# Patient Record
Sex: Female | Born: 1975 | Race: Asian | Hispanic: No | Marital: Married | Smoking: Never smoker
Health system: Southern US, Community
[De-identification: ages and names within clinical notes are randomized; demographics above are authoritative.]

## PROBLEM LIST (undated history)

## (undated) DIAGNOSIS — M810 Age-related osteoporosis without current pathological fracture: Secondary | ICD-10-CM

## (undated) DIAGNOSIS — F419 Anxiety disorder, unspecified: Secondary | ICD-10-CM

## (undated) DIAGNOSIS — I10 Essential (primary) hypertension: Secondary | ICD-10-CM

## (undated) DIAGNOSIS — D649 Anemia, unspecified: Secondary | ICD-10-CM

## (undated) HISTORY — DX: Anemia, unspecified: D64.9

## (undated) HISTORY — PX: BACK SURGERY: SHX140

## (undated) HISTORY — DX: Anxiety disorder, unspecified: F41.9

## (undated) HISTORY — DX: Essential (primary) hypertension: I10

## (undated) HISTORY — DX: Age-related osteoporosis without current pathological fracture: M81.0

---

## 2000-06-05 ENCOUNTER — Ambulatory Visit (HOSPITAL_COMMUNITY): Admission: RE | Admit: 2000-06-05 | Discharge: 2000-06-05 | Payer: Self-pay | Admitting: Family Medicine

## 2000-06-05 ENCOUNTER — Encounter: Payer: Self-pay | Admitting: Family Medicine

## 2001-01-09 ENCOUNTER — Encounter: Payer: Self-pay | Admitting: Family Medicine

## 2001-01-09 ENCOUNTER — Ambulatory Visit (HOSPITAL_COMMUNITY): Admission: RE | Admit: 2001-01-09 | Discharge: 2001-01-09 | Payer: Self-pay | Admitting: Family Medicine

## 2001-06-16 ENCOUNTER — Emergency Department (HOSPITAL_COMMUNITY): Admission: EM | Admit: 2001-06-16 | Discharge: 2001-06-16 | Payer: Self-pay | Admitting: Emergency Medicine

## 2003-02-09 ENCOUNTER — Ambulatory Visit (HOSPITAL_COMMUNITY): Admission: RE | Admit: 2003-02-09 | Discharge: 2003-02-09 | Payer: Self-pay | Admitting: *Deleted

## 2003-02-09 ENCOUNTER — Encounter: Payer: Self-pay | Admitting: *Deleted

## 2004-04-21 ENCOUNTER — Other Ambulatory Visit: Admission: RE | Admit: 2004-04-21 | Discharge: 2004-04-21 | Payer: Self-pay | Admitting: Family Medicine

## 2005-04-24 ENCOUNTER — Other Ambulatory Visit: Admission: RE | Admit: 2005-04-24 | Discharge: 2005-04-24 | Payer: Self-pay | Admitting: Family Medicine

## 2005-07-12 ENCOUNTER — Encounter: Admission: RE | Admit: 2005-07-12 | Discharge: 2005-10-10 | Payer: Self-pay | Admitting: *Deleted

## 2006-04-26 ENCOUNTER — Other Ambulatory Visit: Admission: RE | Admit: 2006-04-26 | Discharge: 2006-04-26 | Payer: Self-pay | Admitting: Family Medicine

## 2007-05-07 ENCOUNTER — Other Ambulatory Visit: Admission: RE | Admit: 2007-05-07 | Discharge: 2007-05-07 | Payer: Self-pay | Admitting: Family Medicine

## 2007-10-16 DIAGNOSIS — M329 Systemic lupus erythematosus, unspecified: Secondary | ICD-10-CM

## 2007-10-16 DIAGNOSIS — IMO0002 Reserved for concepts with insufficient information to code with codable children: Secondary | ICD-10-CM

## 2007-10-16 HISTORY — DX: Reserved for concepts with insufficient information to code with codable children: IMO0002

## 2007-10-16 HISTORY — DX: Systemic lupus erythematosus, unspecified: M32.9

## 2007-12-25 ENCOUNTER — Encounter: Admission: RE | Admit: 2007-12-25 | Discharge: 2007-12-25 | Payer: Self-pay | Admitting: Gastroenterology

## 2008-01-05 ENCOUNTER — Ambulatory Visit (HOSPITAL_COMMUNITY): Admission: RE | Admit: 2008-01-05 | Discharge: 2008-01-05 | Payer: Self-pay | Admitting: Gastroenterology

## 2008-01-14 ENCOUNTER — Ambulatory Visit (HOSPITAL_COMMUNITY): Admission: RE | Admit: 2008-01-14 | Discharge: 2008-01-14 | Payer: Self-pay | Admitting: Gastroenterology

## 2008-06-14 ENCOUNTER — Other Ambulatory Visit: Admission: RE | Admit: 2008-06-14 | Discharge: 2008-06-14 | Payer: Self-pay | Admitting: Family Medicine

## 2008-09-24 ENCOUNTER — Encounter: Admission: RE | Admit: 2008-09-24 | Discharge: 2008-09-24 | Payer: Self-pay | Admitting: Rheumatology

## 2008-11-29 ENCOUNTER — Encounter: Admission: RE | Admit: 2008-11-29 | Discharge: 2008-11-29 | Payer: Self-pay | Admitting: Pulmonary Disease

## 2011-05-27 ENCOUNTER — Ambulatory Visit: Payer: Self-pay

## 2011-05-27 ENCOUNTER — Emergency Department: Payer: Self-pay | Admitting: Internal Medicine

## 2011-05-30 ENCOUNTER — Emergency Department: Payer: Self-pay | Admitting: Emergency Medicine

## 2011-06-03 ENCOUNTER — Emergency Department: Payer: Self-pay | Admitting: *Deleted

## 2011-06-10 ENCOUNTER — Emergency Department: Payer: Self-pay | Admitting: Internal Medicine

## 2011-08-01 DIAGNOSIS — M329 Systemic lupus erythematosus, unspecified: Secondary | ICD-10-CM | POA: Insufficient documentation

## 2011-08-01 DIAGNOSIS — M3500 Sicca syndrome, unspecified: Secondary | ICD-10-CM | POA: Insufficient documentation

## 2012-06-20 ENCOUNTER — Ambulatory Visit (HOSPITAL_COMMUNITY)
Admission: RE | Admit: 2012-06-20 | Discharge: 2012-06-20 | Disposition: A | Discharge status: Home / Self Care | Payer: BC Managed Care – PPO | Source: Ambulatory Visit | Attending: Family Medicine | Admitting: Family Medicine

## 2012-06-20 ENCOUNTER — Other Ambulatory Visit (HOSPITAL_COMMUNITY): Payer: Self-pay | Admitting: Family Medicine

## 2012-06-20 DIAGNOSIS — M25559 Pain in unspecified hip: Secondary | ICD-10-CM

## 2013-01-05 ENCOUNTER — Ambulatory Visit: Payer: Self-pay | Admitting: Physician Assistant

## 2014-01-05 DIAGNOSIS — O09819 Supervision of pregnancy resulting from assisted reproductive technology, unspecified trimester: Secondary | ICD-10-CM | POA: Insufficient documentation

## 2014-01-05 DIAGNOSIS — O3410 Maternal care for benign tumor of corpus uteri, unspecified trimester: Secondary | ICD-10-CM | POA: Insufficient documentation

## 2014-01-05 DIAGNOSIS — D259 Leiomyoma of uterus, unspecified: Secondary | ICD-10-CM | POA: Insufficient documentation

## 2015-12-28 DIAGNOSIS — N390 Urinary tract infection, site not specified: Secondary | ICD-10-CM | POA: Insufficient documentation

## 2016-10-18 DIAGNOSIS — M329 Systemic lupus erythematosus, unspecified: Secondary | ICD-10-CM | POA: Diagnosis not present

## 2016-10-18 DIAGNOSIS — M545 Low back pain: Secondary | ICD-10-CM | POA: Diagnosis not present

## 2016-10-18 DIAGNOSIS — M5416 Radiculopathy, lumbar region: Secondary | ICD-10-CM | POA: Diagnosis not present

## 2016-10-18 DIAGNOSIS — I10 Essential (primary) hypertension: Secondary | ICD-10-CM | POA: Diagnosis not present

## 2016-10-24 ENCOUNTER — Other Ambulatory Visit: Payer: Self-pay | Admitting: Rheumatology

## 2016-10-24 DIAGNOSIS — M5416 Radiculopathy, lumbar region: Secondary | ICD-10-CM

## 2016-10-24 DIAGNOSIS — M545 Low back pain: Secondary | ICD-10-CM

## 2016-11-01 ENCOUNTER — Ambulatory Visit
Admission: RE | Admit: 2016-11-01 | Discharge: 2016-11-01 | Disposition: A | Discharge status: Home / Self Care | Payer: 59 | Source: Ambulatory Visit | Attending: Rheumatology | Admitting: Rheumatology

## 2016-11-01 DIAGNOSIS — M5416 Radiculopathy, lumbar region: Secondary | ICD-10-CM

## 2016-11-01 DIAGNOSIS — M5126 Other intervertebral disc displacement, lumbar region: Secondary | ICD-10-CM | POA: Diagnosis not present

## 2016-11-01 DIAGNOSIS — M545 Low back pain: Secondary | ICD-10-CM

## 2016-11-07 ENCOUNTER — Other Ambulatory Visit: Payer: Self-pay | Admitting: Rheumatology

## 2016-11-07 DIAGNOSIS — M5127 Other intervertebral disc displacement, lumbosacral region: Secondary | ICD-10-CM

## 2016-11-09 ENCOUNTER — Ambulatory Visit
Admission: RE | Admit: 2016-11-09 | Discharge: 2016-11-09 | Disposition: A | Discharge status: Home / Self Care | Payer: 59 | Source: Ambulatory Visit | Attending: Rheumatology | Admitting: Rheumatology

## 2016-11-09 DIAGNOSIS — M5127 Other intervertebral disc displacement, lumbosacral region: Secondary | ICD-10-CM

## 2016-11-09 DIAGNOSIS — M5126 Other intervertebral disc displacement, lumbar region: Secondary | ICD-10-CM | POA: Diagnosis not present

## 2016-11-09 MED ORDER — METHYLPREDNISOLONE ACETATE 40 MG/ML INJ SUSP (RADIOLOG
120.0000 mg | Freq: Once | INTRAMUSCULAR | Status: AC
  Administered 2016-11-09: 120 mg via EPIDURAL

## 2016-11-09 MED ORDER — IOPAMIDOL (ISOVUE-M 200) INJECTION 41%
1.0000 mL | Freq: Once | INTRAMUSCULAR | Status: AC
  Administered 2016-11-09: 1 mL via EPIDURAL

## 2016-11-09 NOTE — Discharge Instructions (Signed)

## 2016-11-13 DIAGNOSIS — M5416 Radiculopathy, lumbar region: Secondary | ICD-10-CM | POA: Diagnosis not present

## 2016-11-13 DIAGNOSIS — M5126 Other intervertebral disc displacement, lumbar region: Secondary | ICD-10-CM | POA: Diagnosis not present

## 2016-11-20 DIAGNOSIS — M545 Low back pain: Secondary | ICD-10-CM | POA: Diagnosis not present

## 2016-11-20 DIAGNOSIS — M329 Systemic lupus erythematosus, unspecified: Secondary | ICD-10-CM | POA: Diagnosis not present

## 2016-11-20 DIAGNOSIS — M5416 Radiculopathy, lumbar region: Secondary | ICD-10-CM | POA: Diagnosis not present

## 2016-12-06 DIAGNOSIS — Z01812 Encounter for preprocedural laboratory examination: Secondary | ICD-10-CM | POA: Diagnosis not present

## 2016-12-14 DIAGNOSIS — M79661 Pain in right lower leg: Secondary | ICD-10-CM | POA: Diagnosis not present

## 2016-12-14 DIAGNOSIS — M5127 Other intervertebral disc displacement, lumbosacral region: Secondary | ICD-10-CM | POA: Diagnosis not present

## 2016-12-14 DIAGNOSIS — M5126 Other intervertebral disc displacement, lumbar region: Secondary | ICD-10-CM | POA: Diagnosis not present

## 2017-02-27 DIAGNOSIS — M810 Age-related osteoporosis without current pathological fracture: Secondary | ICD-10-CM | POA: Diagnosis not present

## 2017-02-27 DIAGNOSIS — I7300 Raynaud's syndrome without gangrene: Secondary | ICD-10-CM | POA: Diagnosis not present

## 2017-02-27 DIAGNOSIS — M5416 Radiculopathy, lumbar region: Secondary | ICD-10-CM | POA: Insufficient documentation

## 2017-02-27 DIAGNOSIS — M328 Other forms of systemic lupus erythematosus: Secondary | ICD-10-CM | POA: Diagnosis not present

## 2017-02-27 DIAGNOSIS — M3501 Sicca syndrome with keratoconjunctivitis: Secondary | ICD-10-CM | POA: Diagnosis not present

## 2017-03-01 DIAGNOSIS — J069 Acute upper respiratory infection, unspecified: Secondary | ICD-10-CM | POA: Diagnosis not present

## 2017-03-14 DIAGNOSIS — R808 Other proteinuria: Secondary | ICD-10-CM | POA: Diagnosis not present

## 2017-03-14 DIAGNOSIS — R319 Hematuria, unspecified: Secondary | ICD-10-CM | POA: Diagnosis not present

## 2017-04-18 DIAGNOSIS — I10 Essential (primary) hypertension: Secondary | ICD-10-CM | POA: Diagnosis not present

## 2017-06-06 DIAGNOSIS — J069 Acute upper respiratory infection, unspecified: Secondary | ICD-10-CM | POA: Diagnosis not present

## 2017-06-06 DIAGNOSIS — R05 Cough: Secondary | ICD-10-CM | POA: Diagnosis not present

## 2017-06-26 DIAGNOSIS — M328 Other forms of systemic lupus erythematosus: Secondary | ICD-10-CM | POA: Diagnosis not present

## 2017-06-26 DIAGNOSIS — I7300 Raynaud's syndrome without gangrene: Secondary | ICD-10-CM | POA: Diagnosis not present

## 2017-06-26 DIAGNOSIS — M3501 Sicca syndrome with keratoconjunctivitis: Secondary | ICD-10-CM | POA: Diagnosis not present

## 2017-08-08 DIAGNOSIS — R112 Nausea with vomiting, unspecified: Secondary | ICD-10-CM | POA: Diagnosis not present

## 2017-08-08 DIAGNOSIS — K219 Gastro-esophageal reflux disease without esophagitis: Secondary | ICD-10-CM | POA: Diagnosis not present

## 2017-10-22 DIAGNOSIS — I10 Essential (primary) hypertension: Secondary | ICD-10-CM | POA: Diagnosis not present

## 2017-11-26 DIAGNOSIS — M3501 Sicca syndrome with keratoconjunctivitis: Secondary | ICD-10-CM | POA: Diagnosis not present

## 2017-11-26 DIAGNOSIS — I7300 Raynaud's syndrome without gangrene: Secondary | ICD-10-CM | POA: Diagnosis not present

## 2017-11-26 DIAGNOSIS — M328 Other forms of systemic lupus erythematosus: Secondary | ICD-10-CM | POA: Diagnosis not present

## 2017-12-03 DIAGNOSIS — M3210 Systemic lupus erythematosus, organ or system involvement unspecified: Secondary | ICD-10-CM | POA: Diagnosis not present

## 2018-02-10 ENCOUNTER — Other Ambulatory Visit: Payer: Self-pay | Admitting: Family Medicine

## 2018-02-10 ENCOUNTER — Ambulatory Visit
Admission: RE | Admit: 2018-02-10 | Discharge: 2018-02-10 | Disposition: A | Discharge status: Home / Self Care | Payer: 59 | Source: Ambulatory Visit | Attending: Family Medicine | Admitting: Family Medicine

## 2018-02-10 DIAGNOSIS — J209 Acute bronchitis, unspecified: Secondary | ICD-10-CM | POA: Diagnosis not present

## 2018-02-10 DIAGNOSIS — R05 Cough: Secondary | ICD-10-CM | POA: Diagnosis not present

## 2018-02-10 DIAGNOSIS — J40 Bronchitis, not specified as acute or chronic: Secondary | ICD-10-CM

## 2018-02-19 DIAGNOSIS — R112 Nausea with vomiting, unspecified: Secondary | ICD-10-CM | POA: Diagnosis not present

## 2018-04-22 DIAGNOSIS — M328 Other forms of systemic lupus erythematosus: Secondary | ICD-10-CM | POA: Diagnosis not present

## 2018-04-22 DIAGNOSIS — M810 Age-related osteoporosis without current pathological fracture: Secondary | ICD-10-CM | POA: Diagnosis not present

## 2018-04-22 DIAGNOSIS — M3501 Sicca syndrome with keratoconjunctivitis: Secondary | ICD-10-CM | POA: Diagnosis not present

## 2018-05-01 DIAGNOSIS — K3184 Gastroparesis: Secondary | ICD-10-CM | POA: Diagnosis not present

## 2018-05-01 DIAGNOSIS — R112 Nausea with vomiting, unspecified: Secondary | ICD-10-CM | POA: Diagnosis not present

## 2018-05-01 DIAGNOSIS — R1013 Epigastric pain: Secondary | ICD-10-CM | POA: Diagnosis not present

## 2018-05-02 DIAGNOSIS — I10 Essential (primary) hypertension: Secondary | ICD-10-CM | POA: Diagnosis not present

## 2018-05-02 DIAGNOSIS — L930 Discoid lupus erythematosus: Secondary | ICD-10-CM | POA: Diagnosis not present

## 2018-05-06 DIAGNOSIS — Z780 Asymptomatic menopausal state: Secondary | ICD-10-CM | POA: Diagnosis not present

## 2020-05-15 HISTORY — PX: CHOLECYSTECTOMY: SHX55

## 2020-08-11 ENCOUNTER — Ambulatory Visit: Payer: 59 | Attending: Internal Medicine

## 2020-08-11 DIAGNOSIS — Z23 Encounter for immunization: Secondary | ICD-10-CM

## 2020-08-11 NOTE — Progress Notes (Signed)
   Covid-19 Vaccination Clinic  Name:  Gloria Mcpherson    MRN: [id number] DOB: Dec 07, 1975  08/11/2020  Gloria Mcpherson was observed post Covid-19 immunization for 15 minutes without incident. She was provided with Vaccine Information Sheet and instruction to access the V-Safe system.   Gloria Mcpherson was instructed to call 911 with any severe reactions post vaccine: Marland Kitchen Difficulty breathing  . Swelling of face and throat  . A fast heartbeat  . A bad rash all over body  . Dizziness and weakness

## 2020-10-18 ENCOUNTER — Other Ambulatory Visit: Payer: 59

## 2020-10-18 DIAGNOSIS — Z20822 Contact with and (suspected) exposure to covid-19: Secondary | ICD-10-CM

## 2020-10-20 LAB — SARS-COV-2, NAA 2 DAY TAT

## 2020-10-20 LAB — NOVEL CORONAVIRUS, NAA: SARS-CoV-2, NAA: NOT DETECTED

## 2020-12-12 DIAGNOSIS — D649 Anemia, unspecified: Secondary | ICD-10-CM | POA: Insufficient documentation

## 2020-12-12 NOTE — Progress Notes (Signed)
[facility] [facility]  [address], [address] [place], [place] [address] Phone: [phone]  Fax: [phone]   Clinic Day:  12/13/2020  Referring physician: Patterson HammersmithPatel, Mayur K, MD  Chief Complaint: Gloria Mcpherson is a 45 y.o. female with anemia who is referred in consultation by Dr. Gerrie NordmannMayur Patel for assessment and management.   HPI:  The patient saw Dr. Allena KatzPatel, rheumatology, on 11/30/2020. The patient has lupus and Sjogren's. She takes Plaquenil 200 mg BID and prednisone 5 mg daily. Hematocrit was 31.7, hemoglobin 9.2, MCV 78.7, platelets 426,000, WBC 8,600.  CBC followed: 03/04/2012:  Hematocrit 38.6, hemoglobin 12.4, MCV 93.7, platelets 210,000, WBC   3,900. 06/17/2014:  Hematocrit 28.4, hemoglobin   9.0, MCV 85.0, platelets 287,000, WBC   9,000. 12/26/2015:  Hematocrit 37.2, hemoglobin 12.4, MCV 89.0, platelets 215,000, WBC 11,200. 06/26/2017:  Hematocrit 37.8, hemoglobin 12.1, MCV 88.7, platelets 318,000, WBC   9,500. 02/03/2018:  Hematocrit 37.4, hemoglobin 12.3, MCV 88.2, platelets 288,000, WBC   7,100. 04/22/2018:  Hematocrit 37.3, hemoglobin 12.1, MCV 91.2, platelets 276,000, WBC   9,100. 10/23/2018:  Hematocrit 37.2, hemoglobin 11.9, MCV 91.2, platelets 347,000, WBC   8,700. 04/23/2019:  Hematocrit 34.3, hemoglobin 10.7, MCV 86.0, platelets 304,000, WBC   8,000. 10/22/2019:  Hematocrit 34.8, hemoglobin 10.6, MCV 83.7, platelets 341,000, WBC   7,600. 04/21/2020:  Hematocrit 35.8, hemoglobin 10.4, MCV 78.9, platelets 413,000, WBC   8,300. 07/07/2020:  Hematocrit 32.5, hemoglobin   9.9, MCV 74.3, platelets 384,000, WBC   9,200. 11/30/2020:  Hematocrit 31.7, hemoglobin   9.2, MCV 78.7, platelets 426,000, WBC   8,600.  Additional labs:  Vitamin B12 was 667 on 06/17/2014 and 478 on 02/03/2018.  Ferritin was 30 on 06/21/2014. Iron saturation was 5% and TIBC was 532 on 06/17/2014. Iron studies were normal on 03/04/2012.  CMP was normal on 11/30/2020.  No  colonoscopy or EGD on record. Per patient, last EGD was 2 years ago. Her last colonoscopy was when she was in high school for GI problems (vomiting, abdominal pain, slow transit).  Symptomatically, the patient has chronic abdominal symptoms. The patient has gastroparesis. She has abdominal pain, nausea, vomiting, and constipation. She does not do well with solid foods, especially meat. Her diet consists mostly of of fruits, vegetables, some beans, and soy products. She is trying to work fish back into her diet. She vomits when she eats fatty foods. She has been having these issues since 2008 and is not sure what started it. [facility] and has had GI problems for her entire life.  When she has bad episodes of abdominal symptoms, she resorts to only eating liquids and soups. She then adds solid foods back into her diet as tolerated. She takes lorazepam (for severe nausea), digestive enzymes, amitriptyline for stomach pain, compazine, and Marinol. Stress makes her symptoms worse.  Her lupus and Sjogren's are "not bad" right now. When they get bad, she increases her prednisone dose for a couple of days. Her shoulders, knees, elbows, wrists, and back ache. She sometimes gets dizzy when she stands up. Lately, she has felt "off" cognitively. For example, she has trouble spelling words that she should know how to spell when typing.  The patient denies fevers, sweats, headaches, changes in vision, runny nose, sore throat, cough, shortness of breath, chest pain, palpitations, urinary symptoms, numbness, weakness, and balance or coordination problems.  The patient has rare periods that last for about 2 days. Her periods have been irregular like this for 4-5  years. She denies any other bleeding. The patient received IV iron in the past while pregnant.  Her great aunt has lupus. Her great grandmother had cancer. She has a significant family history of heart attacks and strokes.   Past  Medical History:  Diagnosis Date  . Anemia   . Anxiety   . Hypertension   . Lupus (HCC)   . Lupus (HCC) 2009  . Osteoporosis     Past Surgical History:  Procedure Laterality Date  . BACK SURGERY     2019 had a laminectomy  . CESAREAN SECTION  07/2014  . CHOLECYSTECTOMY    . CHOLECYSTECTOMY  05/2020    Family History  Problem Relation Age of Onset  . Hypertension Mother   . Hypertension Paternal Uncle   . Diabetes Maternal Grandmother     Social History:  reports that she has never smoked. She has never used smokeless tobacco. She reports current alcohol use of about 1.0 standard drink of alcohol per week. She reports that she does not use drugs. She has two glasses of wine per week. She denies tobacco use. She has not been exposed to radiation or toxins. She lives in [place]. Her family is from [place]. She has lived in the [place] her whole life and spent summers in [place]. 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  Allergies:  Allergies  Allergen Reactions  . Cat Hair Extract Itching    Current Medications: Current Outpatient Medications  Medication Sig Dispense Refill  . acetaminophen (TYLENOL) 500 MG tablet Take 500 mg by mouth every 6 (six) hours as needed.    Marland Kitchen alendronate (FOSAMAX) 70 MG tablet Take 70 mg by mouth.    Marland Kitchen amitriptyline (ELAVIL) 75 MG tablet Take 75 mg by mouth.    Marland Kitchen aspirin EC 81 MG tablet Take 81 mg by mouth.    . dicyclomine (BENTYL) 10 MG capsule Take 10 mg by mouth every 6 (six) hours as needed.    . Digestive Enzymes TABS Take by mouth daily.    . hydroxychloroquine (PLAQUENIL) 200 MG tablet Take 200 mg by mouth daily.    Marland Kitchen LORazepam (ATIVAN) 1 MG tablet Take 1 mg by mouth as needed.    . NON FORMULARY Take by mouth 3 (three) times daily with meals.     . predniSONE (DELTASONE) 10 MG tablet Take 5 mg by mouth at bedtime as needed (take 5-20mg , as needed).    . prochlorperazine (COMPAZINE) 5 MG  tablet TAKE 2 TABLETS EVERY 8 HOURS AS NEEDED FOR NAUSEA    . RABEprazole (ACIPHEX) 20 MG tablet Take 20 mg by mouth daily.     No current facility-administered medications for this visit.    Review of Systems  Constitutional: Negative for chills, diaphoresis, fever, malaise/fatigue and weight loss.  HENT: Negative for congestion, ear discharge, ear pain, hearing loss, nosebleeds, sinus pain, sore throat and tinnitus.   Eyes: Negative for blurred vision.  Respiratory: Negative for cough, hemoptysis, sputum production and shortness of breath.   Cardiovascular: Negative for chest pain, palpitations and leg swelling.  Gastrointestinal: Positive for abdominal pain, constipation, nausea and vomiting. Negative for blood in stool, diarrhea, heartburn and melena.  Genitourinary: Negative for dysuria, frequency, hematuria and urgency.  Musculoskeletal: Positive for back pain and joint pain (knees, shoulders, elbows, wrists). Negative for myalgias and neck pain.  Skin: Negative for itching and rash.  Neurological: Positive for dizziness (when she stands up). Negative for  tingling, sensory change, weakness and headaches.       Feels "off" cognitively  Endo/Heme/Allergies: Does not bruise/bleed easily.  Psychiatric/Behavioral: Negative for depression and memory loss. The patient is not nervous/anxious and does not have insomnia.   All other systems reviewed and are negative.  Performance status (ECOG): 1  Vitals Blood pressure (!) 150/100, pulse (!) 108, temperature (!) 96.1 F (35.6 C), temperature source Tympanic, resp. rate 16, weight 185 lb 6.5 oz (84.1 kg), SpO2 100 %.   Physical Exam Vitals and nursing note reviewed.  Constitutional:      General: She is not in acute distress.    Appearance: She is not diaphoretic.  HENT:     Head: Normocephalic and atraumatic.     Comments: Dark hair.    Mouth/Throat:     Mouth: Mucous membranes are moist.     Pharynx: Oropharynx is clear.  Eyes:      General: No scleral icterus.    Extraocular Movements: Extraocular movements intact.     Conjunctiva/sclera: Conjunctivae normal.     Pupils: Pupils are equal, round, and reactive to light.     Comments: Glasses. Brown eyes.  Cardiovascular:     Rate and Rhythm: Normal rate and regular rhythm.     Heart sounds: Normal heart sounds. No murmur heard.   Pulmonary:     Effort: Pulmonary effort is normal. No respiratory distress.     Breath sounds: Normal breath sounds. No wheezing or rales.  Chest:     Chest wall: No tenderness.  Breasts:     Right: No axillary adenopathy or supraclavicular adenopathy.     Left: No axillary adenopathy or supraclavicular adenopathy.    Abdominal:     General: Bowel sounds are normal. There is no distension.     Palpations: Abdomen is soft. There is no mass.     Tenderness: There is abdominal tenderness. There is no guarding or rebound.  Musculoskeletal:        General: No swelling or tenderness. Normal range of motion.     Cervical back: Normal range of motion and neck supple.  Lymphadenopathy:     Head:     Right side of head: No preauricular, posterior auricular or occipital adenopathy.     Left side of head: No preauricular, posterior auricular or occipital adenopathy.     Cervical: No cervical adenopathy.     Upper Body:     Right upper body: No supraclavicular or axillary adenopathy.     Left upper body: No supraclavicular or axillary adenopathy.     Lower Body: No right inguinal adenopathy. No left inguinal adenopathy.  Skin:    General: Skin is warm and dry.  Neurological:     Mental Status: She is alert and oriented to person, place, and time.  Psychiatric:        Behavior: Behavior normal.        Thought Content: Thought content normal.        Judgment: Judgment normal.    No visits with results within 3 Day(s) from this visit.  Latest known visit with results is:  Lab on 10/18/2020  Component Date Value Ref Range Status  .  SARS-CoV-2, NAA 10/18/2020 Not Detected  Not Detected Final   Comment: This nucleic acid amplification test was developed and its performance characteristics determined by [employer]. Nucleic acid amplification tests include RT-PCR and TMA. This test has not been FDA cleared or approved. This test has been authorized by FDA  under an Emergency Use Authorization (EUA). This test is only authorized for the duration of time the declaration that circumstances exist justifying the authorization of the emergency use of in vitro diagnostic tests for detection of SARS-CoV-2 virus and/or diagnosis of COVID-19 infection under section 564(b)(1) of the Act, 21 U.S.C. 502DXA-1(O) (1), unless the authorization is terminated or revoked sooner. When diagnostic testing is negative, the possibility of a false negative result should be considered in the context of a patient's recent exposures and the presence of clinical signs and symptoms consistent with COVID-19. An individual without symptoms of COVID-19 and who is not shedding SARS-CoV-2 virus wo                          uld expect to have a negative (not detected) result in this assay.   Marland Kitchen SARS-CoV-2, NAA 2 DAY TAT 10/18/2020 Performed   Final    Assessment:  Gloria Mcpherson is a 45 y.o. female with a microcytic anemia felt secondary to iron deficiency.  MCV became microcytic on 04/21/2020.  Labs on 11/30/2020 revealed a hematocrit was 31.7, hemoglobin 9.2, MCV 78.7, platelets 426,000, WBC 8,600.  Ferritin was 30 on 06/21/2014. Iron saturation was 5% and TIBC was 532 on 06/17/2014.    Last EGD was 2 years ago. Her last colonoscopy was when she was in high school for GI problems (vomiting, abdominal pain, slow transit).  B12 has been followed:  667 on 06/17/2014 and 478 on 02/03/2018.  She has lupus and Sjogren's. She takes Plaquenil 200 mg BID and prednisone 5 mg daily.   She received the [employer] on  08/11/2020.  Symptomatically, she has chronic abdominal symptoms affecting her diet.  Menses is minimal.  She denies any bleeding.  Exam is normal.  Plan: 1.   Labs today:  CBC with diff, ferritin, iron studies, sed rate, CRP, B12, folate. 2.   Microcytic anemia  Patient developed microcytic RBC indices on 04/21/2020.  Etiology secondary to iron deficiency.  Discuss GI symptoms and affect on diet.  Patient denies any bleeding.  Discuss consideration of IV iron.   Information provided.  Preauth Venofer. 3.   RTC in 1 week for MD assessment, review of work-up, urine pregnancy test and initiation of week #1 Venofer.  I discussed the assessment and treatment plan with the patient.  The patient was provided an opportunity to ask questions and all were answered.  The patient agreed with the plan and demonstrated an understanding of the instructions.  The patient was advised to call back if the symptoms worsen or if the condition fails to improve as anticipated.  I provided 25 minutes of face-to-face time during this this encounter and > 50% was spent counseling as documented under my assessment and plan.  An additional 15+ minutes were spent reviewing her chart (Epic and Care Everywhere) including notes, labs, and imaging studies.    Keyvon Herter C. Merlene Pulling, MD, PhD    12/13/2020, 11:57 AM  I, Danella Penton Tufford, am acting as Neurosurgeon for [employer]. Merlene Pulling, MD, PhD.  I, Reita Shindler C. Merlene Pulling, MD, have reviewed the above documentation for accuracy and completeness, and I agree with the above.

## 2020-12-13 ENCOUNTER — Inpatient Hospital Stay: Payer: 59 | Attending: Hematology and Oncology | Admitting: Hematology and Oncology

## 2020-12-13 ENCOUNTER — Encounter: Payer: Self-pay | Admitting: Hematology and Oncology

## 2020-12-13 ENCOUNTER — Other Ambulatory Visit: Payer: Self-pay

## 2020-12-13 ENCOUNTER — Inpatient Hospital Stay: Payer: 59

## 2020-12-13 VITALS — BP 150/100 | HR 108 | Temp 96.1°F | Resp 16 | Wt 185.4 lb

## 2020-12-13 DIAGNOSIS — D508 Other iron deficiency anemias: Secondary | ICD-10-CM | POA: Diagnosis not present

## 2020-12-13 DIAGNOSIS — D649 Anemia, unspecified: Secondary | ICD-10-CM

## 2020-12-13 DIAGNOSIS — E538 Deficiency of other specified B group vitamins: Secondary | ICD-10-CM | POA: Diagnosis present

## 2020-12-13 DIAGNOSIS — D509 Iron deficiency anemia, unspecified: Secondary | ICD-10-CM | POA: Insufficient documentation

## 2020-12-13 LAB — FERRITIN: Ferritin: 5 ng/mL — ABNORMAL LOW (ref 11–307)

## 2020-12-13 LAB — CBC WITH DIFFERENTIAL/PLATELET
Abs Immature Granulocytes: 0.06 10*3/uL (ref 0.00–0.07)
Basophils Absolute: 0 10*3/uL (ref 0.0–0.1)
Basophils Relative: 0 %
Eosinophils Absolute: 0 10*3/uL (ref 0.0–0.5)
Eosinophils Relative: 0 %
HCT: 33.6 % — ABNORMAL LOW (ref 36.0–46.0)
Hemoglobin: 9.9 g/dL — ABNORMAL LOW (ref 12.0–15.0)
Immature Granulocytes: 1 %
Lymphocytes Relative: 18 %
Lymphs Abs: 2.1 10*3/uL (ref 0.7–4.0)
MCH: 22.8 pg — ABNORMAL LOW (ref 26.0–34.0)
MCHC: 29.5 g/dL — ABNORMAL LOW (ref 30.0–36.0)
MCV: 77.4 fL — ABNORMAL LOW (ref 80.0–100.0)
Monocytes Absolute: 1 10*3/uL (ref 0.1–1.0)
Monocytes Relative: 8 %
Neutro Abs: 8.5 10*3/uL — ABNORMAL HIGH (ref 1.7–7.7)
Neutrophils Relative %: 73 %
Platelets: 455 10*3/uL — ABNORMAL HIGH (ref 150–400)
RBC: 4.34 MIL/uL (ref 3.87–5.11)
RDW: 17.2 % — ABNORMAL HIGH (ref 11.5–15.5)
WBC: 11.7 10*3/uL — ABNORMAL HIGH (ref 4.0–10.5)
nRBC: 0 % (ref 0.0–0.2)

## 2020-12-13 LAB — FOLATE: Folate: 9.7 ng/mL (ref >5.9)

## 2020-12-13 LAB — IRON AND TIBC
Iron: 15 ug/dL — ABNORMAL LOW (ref 28–170)
Saturation Ratios: 3 % — ABNORMAL LOW (ref 10.4–31.8)
TIBC: 500 ug/dL — ABNORMAL HIGH (ref 250–450)
UIBC: 485 ug/dL

## 2020-12-13 LAB — VITAMIN B12: Vitamin B-12: 265 pg/mL (ref 180–914)

## 2020-12-13 LAB — SEDIMENTATION RATE: Sed Rate: 35 mm/hr — ABNORMAL HIGH (ref 0–20)

## 2020-12-13 LAB — C-REACTIVE PROTEIN: CRP: 1 mg/dL — ABNORMAL HIGH (ref <1.0)

## 2020-12-14 ENCOUNTER — Telehealth: Payer: Self-pay

## 2020-12-14 NOTE — Telephone Encounter (Signed)
-----   Message from Rosey Bath, MD sent at 12/14/2020  5:44 AM EST ----- Regarding: Please call patient  B12 is a little low.  B12 goal is 400.  Would she like to try oral b12 supplementation or B12 injections?  M  ----- Message ----- From: Leory Plowman, Lab In [place] Sent: 12/13/2020  12:46 PM EST To: Rosey Bath, MD

## 2020-12-14 NOTE — Telephone Encounter (Signed)
Patient states that she will try the oral. Advised her to start with per day. Please advise if you would like different. When would you like to recheck levels? Advised patient if she changes her mind to let us know and we can set up injections

## 2020-12-14 NOTE — Telephone Encounter (Signed)
  B12 1000 mcg a day is the standard dose.  I usually check levels in 1 month.  M

## 2020-12-15 ENCOUNTER — Telehealth: Payer: Self-pay

## 2020-12-16 ENCOUNTER — Encounter: Payer: Self-pay | Admitting: Hematology and Oncology

## 2020-12-19 ENCOUNTER — Encounter: Payer: Self-pay | Admitting: Hematology and Oncology

## 2020-12-19 ENCOUNTER — Telehealth: Payer: Self-pay

## 2020-12-19 ENCOUNTER — Other Ambulatory Visit: Payer: Self-pay

## 2020-12-19 DIAGNOSIS — D649 Anemia, unspecified: Secondary | ICD-10-CM

## 2020-12-19 NOTE — Progress Notes (Signed)
[facility]  [address] [place], [place] [address] Phone: [phone]  Fax: [phone]   Clinic Day:  12/20/2020  Referring physician: Deatra James, MD  Chief Complaint: Gloria Mcpherson is a 45 y.o. female with anemia who is seen for review of work-up and discussion regarding direction of therapy.  HPI: The patient was last seen in the hematology clinic on 12/13/2020 for new patient assessment. She noted a long standing history of abdominal symptoms.  She has gastroparesis.  Diet is limited.  Work-up revealed a hematocrit of 33.6, hemoglobin 9.9, MCV 77.4, platelets 455,000, WBC 11,700. Ferritin was 5 with an iron saturation of 3% and a TIBC of 500. Vitamin B12 was 265 and folate 9.7. CRP was 1.0 and sed rate 35.  During the interim, she has been "tired and nauseated." She has been eating a traditional Chinese egg dish this week. It does not bother her stomach as much as other foods.  Her joint pain is stable. She still sometimes gets dizzy when she stands up. She would like a vitamin B12 injection and then will start taking oral B12.  The patient has received Venofer before and tolerated it well.   Past Medical History:  Diagnosis Date  . Anemia   . Anxiety   . Hypertension   . Lupus (HCC)   . Lupus (HCC) 2009  . Osteoporosis     Past Surgical History:  Procedure Laterality Date  . BACK SURGERY     2019 had a laminectomy  . CESAREAN SECTION  07/2014  . CHOLECYSTECTOMY    . CHOLECYSTECTOMY  05/2020    Family History  Problem Relation Age of Onset  . Hypertension Mother   . Hypertension Paternal Uncle   . Diabetes Maternal Grandmother     Social History:  reports that she has never smoked. She has never used smokeless tobacco. She reports current alcohol use of about 1.0 standard drink of alcohol per week. She reports that she does not use drugs. She has two glasses of wine per week. She denies tobacco use. She has not  been exposed to radiation or toxins. She lives in [place]. Her family is from [place]. She has lived in the [place] her whole life and spent summers in [place]. 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  Allergies:  Allergies  Allergen Reactions  . Cat Hair Extract Itching    Current Medications: Current Outpatient Medications  Medication Sig Dispense Refill  . acetaminophen (TYLENOL) 500 MG tablet Take 500 mg by mouth every 6 (six) hours as needed.    Marland Kitchen alendronate (FOSAMAX) 70 MG tablet Take 70 mg by mouth.    Marland Kitchen amitriptyline (ELAVIL) 75 MG tablet Take 75 mg by mouth.    Marland Kitchen amLODIPine-Valsartan-HCTZ 10-160-12.5 MG TABS     . dicyclomine (BENTYL) 10 MG capsule Take 10 mg by mouth every 6 (six) hours as needed.    . Digestive Enzymes TABS Take by mouth daily.    Marland Kitchen dronabinol (MARINOL) 5 MG capsule Take by mouth. Patient takes twice a day    . famotidine (PEPCID) 40 MG tablet Take by mouth.    . hydroxychloroquine (PLAQUENIL) 200 MG tablet Take 200 mg by mouth daily.    Marland Kitchen LORazepam (ATIVAN) 1 MG tablet Take 1 mg by mouth as needed.    . predniSONE (DELTASONE) 10 MG tablet Take 5 mg by mouth at bedtime as needed (take 5-20mg , as needed).    Marland Kitchen  prochlorperazine (COMPAZINE) 5 MG tablet TAKE 2 TABLETS EVERY 8 HOURS AS NEEDED FOR NAUSEA    . RABEprazole (ACIPHEX) 20 MG tablet Take 20 mg by mouth daily.    Marland Kitchen aspirin EC 81 MG tablet Take 81 mg by mouth. (Patient not taking: Reported on 12/20/2020)    . naproxen sodium (ALEVE) 220 MG tablet Take by mouth. (Patient not taking: Reported on 12/20/2020)    . NON FORMULARY Take by mouth 3 (three) times daily with meals.  (Patient not taking: Reported on 12/20/2020)     No current facility-administered medications for this visit.    Review of Systems  Constitutional: Positive for malaise/fatigue. Negative for chills, diaphoresis, fever and weight loss (3 lbs).       Feels "tired."  HENT: Negative for  congestion, ear discharge, ear pain, hearing loss, nosebleeds, sinus pain, sore throat and tinnitus.   Eyes: Negative for blurred vision.  Respiratory: Negative for cough, hemoptysis, sputum production and shortness of breath.   Cardiovascular: Negative for chest pain, palpitations and leg swelling.  Gastrointestinal: Positive for abdominal pain and nausea. Negative for blood in stool, constipation, diarrhea, heartburn, melena and vomiting.  Genitourinary: Negative for dysuria, frequency, hematuria and urgency.  Musculoskeletal: Positive for back pain and joint pain (knees, shoulders, elbows, wrists). Negative for myalgias and neck pain.  Skin: Negative for itching and rash.  Neurological: Positive for dizziness (when she stands up). Negative for tingling, sensory change, weakness and headaches.  Endo/Heme/Allergies: Does not bruise/bleed easily.  Psychiatric/Behavioral: Negative for depression and memory loss. The patient is not nervous/anxious and does not have insomnia.   All other systems reviewed and are negative.  Performance status (ECOG): 1  Vitals Blood pressure 118/80, pulse 88, temperature 97.7 F (36.5 C), temperature source Tympanic, resp. rate 18, weight 182 lb 15.7 oz (83 kg), SpO2 100 %.   Physical Exam Vitals and nursing note reviewed.  Constitutional:      General: She is not in acute distress.    Appearance: She is not diaphoretic.  HENT:     Mouth/Throat:     Mouth: Mucous membranes are moist.     Pharynx: Oropharynx is clear.  Eyes:     General: No scleral icterus.    Conjunctiva/sclera: Conjunctivae normal.  Cardiovascular:     Rate and Rhythm: Normal rate and regular rhythm.     Heart sounds: Normal heart sounds. No murmur heard.   Pulmonary:     Effort: Pulmonary effort is normal. No respiratory distress.     Breath sounds: Normal breath sounds. No wheezing or rales.  Chest:     Chest wall: No tenderness.  Musculoskeletal:     Right lower leg: No  edema.     Left lower leg: No edema.  Neurological:     Mental Status: She is alert and oriented to person, place, and time.  Psychiatric:        Behavior: Behavior normal.        Thought Content: Thought content normal.        Judgment: Judgment normal.    Appointment on 12/20/2020  Component Date Value Ref Range Status  . Preg Test, Ur 12/20/2020 NEGATIVE  NEGATIVE Final   Performed at [facility], [address], [place], [place] [address]    Assessment:  Gloria Mcpherson is a 45 y.o. female with iron deficiency anemia secondary to diet and chronic GI symptoms.  MCV became microcytic on 04/21/2020.  Labs on 11/30/2020 revealed a hematocrit was  31.7, hemoglobin 9.2, MCV 78.7, platelets 426,000, WBC 8,600.  Ferritin was 30 on 06/21/2014. Iron saturation was 5% and TIBC was 532 on 06/17/2014.    Work-up on 12/13/2020 revealed a hematocrit of 33.6, hemoglobin 9.9, MCV 77.4, platelets 455,000, WBC 11,700.  Ferritin was 5 with an iron saturation of 3% and a TIBC of 500. Vitamin B12 was 265 and folate 9.7. CRP was 1.0 and sed rate 35.  Last colonoscopy was in high school.  EGD in 2020 is unavailable.  She is followed by Dr Beverly Gust in gastroenterology at [facility].  B12 has been followed:  667 on 06/17/2014, 478 on 02/03/2018, and 265 on 12/13/2020.  She has lupus and Sjogren's. She is on Plaquenil 200 mg BID and prednisone 5 mg daily.   The patient received the [employer] on 08/11/2020.  Symptomatically, she feels "tired and nauseated." Exam is stable.  Plan: 1.   Iron deficiency anemia  Hematocrit 33.6.  Hemoglobin 9.9.  MCV 77.4 on 12/13/2020.     Ferritin 5 with an iron saturation of 3%.    She developed microcytic RBC indices in 04/21/2020.  She denies any bleeding.  Venofer today and weekly x2 (total 3) with urine pregnancy test. 2.   B12 deficiency  B12 was 265 on 12/13/2020.  B12 today. 3.   RTC in 6-8 weeks for  labs (CBC, ferritin, B12). 4.   RTC in 4 months for MD assessment, labs (CBC, ferritin, iron studies- day before) and +/- Venofer.  I discussed the assessment and treatment plan with the patient.  The patient was provided an opportunity to ask questions and all were answered.  The patient agreed with the plan and demonstrated an understanding of the instructions.  The patient was advised to call back if the symptoms worsen or if the condition fails to improve as anticipated.   Diasha Castleman C. Merlene Pulling, MD, PhD    12/20/2020, 11:37 AM  I, Danella Penton Tufford, am acting as Neurosurgeon for [employer]. Merlene Pulling, MD, PhD.  I, Aneta Hendershott C. Merlene Pulling, MD, have reviewed the above documentation for accuracy and completeness, and I agree with the above.

## 2020-12-20 ENCOUNTER — Inpatient Hospital Stay: Payer: 59

## 2020-12-20 ENCOUNTER — Other Ambulatory Visit: Payer: Self-pay

## 2020-12-20 ENCOUNTER — Inpatient Hospital Stay: Payer: 59 | Admitting: Hematology and Oncology

## 2020-12-20 ENCOUNTER — Ambulatory Visit: Payer: 59

## 2020-12-20 ENCOUNTER — Other Ambulatory Visit: Payer: Self-pay | Admitting: Hematology and Oncology

## 2020-12-20 ENCOUNTER — Encounter: Payer: Self-pay | Admitting: Hematology and Oncology

## 2020-12-20 VITALS — BP 118/80 | HR 88 | Temp 97.7°F | Resp 18 | Wt 183.0 lb

## 2020-12-20 DIAGNOSIS — D508 Other iron deficiency anemias: Secondary | ICD-10-CM

## 2020-12-20 DIAGNOSIS — E538 Deficiency of other specified B group vitamins: Secondary | ICD-10-CM

## 2020-12-20 DIAGNOSIS — D649 Anemia, unspecified: Secondary | ICD-10-CM

## 2020-12-20 LAB — PREGNANCY, URINE: Preg Test, Ur: NEGATIVE

## 2020-12-20 MED ORDER — SODIUM CHLORIDE 0.9 % IV SOLN
Freq: Once | INTRAVENOUS | Status: AC
  Filled 2020-12-20: qty 250

## 2020-12-20 MED ORDER — IRON SUCROSE 20 MG/ML IV SOLN
200.0000 mg | Freq: Once | INTRAVENOUS | Status: AC
  Administered 2020-12-20: 200 mg via INTRAVENOUS
  Filled 2020-12-20: qty 10

## 2020-12-20 MED ORDER — CYANOCOBALAMIN 1000 MCG/ML IJ SOLN
1000.0000 ug | Freq: Once | INTRAMUSCULAR | Status: AC
  Administered 2020-12-20: 1000 ug via INTRAMUSCULAR
  Filled 2020-12-20: qty 1

## 2020-12-20 MED ORDER — SODIUM CHLORIDE 0.9 % IV SOLN: 200.0000 mg | Freq: Once | INTRAVENOUS | Status: DC

## 2020-12-20 NOTE — Telephone Encounter (Signed)
Sure thing! I've made a note of it and will schedule when she checks out today. Thanks! Victorino Dike

## 2020-12-20 NOTE — Progress Notes (Signed)
Patient reports nausea 

## 2020-12-20 NOTE — Progress Notes (Signed)
Pt received prescribed treatment in clinic, pt stable at d/c. 

## 2020-12-27 ENCOUNTER — Inpatient Hospital Stay: Payer: 59

## 2020-12-27 ENCOUNTER — Other Ambulatory Visit: Payer: Self-pay

## 2020-12-27 VITALS — BP 117/83 | HR 105 | Temp 97.6°F | Resp 18

## 2020-12-27 DIAGNOSIS — D508 Other iron deficiency anemias: Secondary | ICD-10-CM

## 2020-12-27 DIAGNOSIS — E538 Deficiency of other specified B group vitamins: Secondary | ICD-10-CM

## 2020-12-27 LAB — PREGNANCY, URINE: Preg Test, Ur: NEGATIVE

## 2020-12-27 MED ORDER — SODIUM CHLORIDE 0.9 % IV SOLN: 200.0000 mg | Freq: Once | INTRAVENOUS | Status: DC

## 2020-12-27 MED ORDER — SODIUM CHLORIDE 0.9 % IV SOLN
Freq: Once | INTRAVENOUS | Status: AC
  Filled 2020-12-27: qty 250

## 2020-12-27 MED ORDER — IRON SUCROSE 20 MG/ML IV SOLN
200.0000 mg | Freq: Once | INTRAVENOUS | Status: AC
  Administered 2020-12-27: 200 mg via INTRAVENOUS
  Filled 2020-12-27: qty 10

## 2021-01-03 ENCOUNTER — Other Ambulatory Visit: Payer: Self-pay

## 2021-01-03 ENCOUNTER — Inpatient Hospital Stay: Payer: 59

## 2021-01-03 VITALS — BP 120/82 | HR 100

## 2021-01-03 DIAGNOSIS — D508 Other iron deficiency anemias: Secondary | ICD-10-CM

## 2021-01-03 DIAGNOSIS — E538 Deficiency of other specified B group vitamins: Secondary | ICD-10-CM

## 2021-01-03 LAB — PREGNANCY, URINE: Preg Test, Ur: NEGATIVE

## 2021-01-03 MED ORDER — SODIUM CHLORIDE 0.9 % IV SOLN: 200.0000 mg | Freq: Once | INTRAVENOUS | Status: DC

## 2021-01-03 MED ORDER — IRON SUCROSE 20 MG/ML IV SOLN
200.0000 mg | Freq: Once | INTRAVENOUS | Status: AC
  Administered 2021-01-03: 200 mg via INTRAVENOUS
  Filled 2021-01-03: qty 10

## 2021-01-03 MED ORDER — SODIUM CHLORIDE 0.9 % IV SOLN
Freq: Once | INTRAVENOUS | Status: AC
Start: 2021-01-03 — End: 2021-01-03
  Filled 2021-01-03: qty 250

## 2021-01-03 NOTE — Progress Notes (Signed)
Patient tolerated Venofer infusion well, no concerns voiced. Patient discharged. Stable. 

## 2021-01-04 ENCOUNTER — Other Ambulatory Visit: Payer: Self-pay | Admitting: Family Medicine

## 2021-01-04 DIAGNOSIS — Z1231 Encounter for screening mammogram for malignant neoplasm of breast: Secondary | ICD-10-CM

## 2021-01-11 ENCOUNTER — Ambulatory Visit
Admission: RE | Admit: 2021-01-11 | Discharge: 2021-01-11 | Disposition: A | Discharge status: Home / Self Care | Payer: 59 | Source: Ambulatory Visit | Attending: Family Medicine | Admitting: Family Medicine

## 2021-01-11 ENCOUNTER — Other Ambulatory Visit: Payer: Self-pay

## 2021-01-11 DIAGNOSIS — Z1231 Encounter for screening mammogram for malignant neoplasm of breast: Secondary | ICD-10-CM

## 2021-01-13 ENCOUNTER — Other Ambulatory Visit: Payer: Self-pay | Admitting: Family Medicine

## 2021-01-13 DIAGNOSIS — R928 Other abnormal and inconclusive findings on diagnostic imaging of breast: Secondary | ICD-10-CM

## 2021-01-23 ENCOUNTER — Inpatient Hospital Stay: Payer: 59 | Attending: Hematology and Oncology

## 2021-01-23 ENCOUNTER — Other Ambulatory Visit: Payer: Self-pay

## 2021-01-23 DIAGNOSIS — E538 Deficiency of other specified B group vitamins: Secondary | ICD-10-CM | POA: Diagnosis not present

## 2021-01-23 DIAGNOSIS — D508 Other iron deficiency anemias: Secondary | ICD-10-CM | POA: Diagnosis not present

## 2021-01-23 MED ORDER — CYANOCOBALAMIN 1000 MCG/ML IJ SOLN
1000.0000 ug | Freq: Once | INTRAMUSCULAR | Status: AC
  Administered 2021-01-23: 1000 ug via INTRAMUSCULAR
  Filled 2021-01-23: qty 1

## 2021-02-01 ENCOUNTER — Ambulatory Visit
Admission: RE | Admit: 2021-02-01 | Discharge: 2021-02-01 | Disposition: A | Discharge status: Home / Self Care | Payer: 59 | Source: Ambulatory Visit | Attending: Family Medicine | Admitting: Family Medicine

## 2021-02-01 ENCOUNTER — Other Ambulatory Visit: Payer: Self-pay

## 2021-02-01 DIAGNOSIS — R928 Other abnormal and inconclusive findings on diagnostic imaging of breast: Secondary | ICD-10-CM

## 2021-02-07 ENCOUNTER — Inpatient Hospital Stay: Payer: 59

## 2021-02-07 ENCOUNTER — Other Ambulatory Visit: Payer: Self-pay

## 2021-02-07 DIAGNOSIS — D508 Other iron deficiency anemias: Secondary | ICD-10-CM

## 2021-02-07 DIAGNOSIS — D649 Anemia, unspecified: Secondary | ICD-10-CM

## 2021-02-07 DIAGNOSIS — E538 Deficiency of other specified B group vitamins: Secondary | ICD-10-CM | POA: Diagnosis not present

## 2021-02-07 LAB — CBC
HCT: 34.8 % — ABNORMAL LOW (ref 36.0–46.0)
Hemoglobin: 10.9 g/dL — ABNORMAL LOW (ref 12.0–15.0)
MCH: 26.3 pg (ref 26.0–34.0)
MCHC: 31.3 g/dL (ref 30.0–36.0)
MCV: 83.9 fL (ref 80.0–100.0)
Platelets: 324 10*3/uL (ref 150–400)
RBC: 4.15 MIL/uL (ref 3.87–5.11)
RDW: 20.3 % — ABNORMAL HIGH (ref 11.5–15.5)
WBC: 8.8 10*3/uL (ref 4.0–10.5)
nRBC: 0 % (ref 0.0–0.2)

## 2021-02-07 LAB — FERRITIN: Ferritin: 48 ng/mL (ref 11–307)

## 2021-02-07 LAB — VITAMIN B12: Vitamin B-12: 794 pg/mL (ref 180–914)

## 2021-02-09 ENCOUNTER — Telehealth: Payer: Self-pay | Admitting: *Deleted

## 2021-02-09 NOTE — Telephone Encounter (Signed)
Patient called asking if anything needs to be done about her low lab results. She has an appointment 5/11 for a B 12 injection. No follow up until July.  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   0 Result Notes  Component Ref Range & Units 2 d ago 1 mo ago  WBC 4.0 - 10.5 K/uL 8.8  11.7High   RBC 3.87 - 5.11 MIL/uL 4.15  4.34   Hemoglobin 12.0 - 15.0 g/dL 85.2DPO  2.4MPN   HCT 36.1 - 46.0 % 34.8Low  33.6Low   MCV 80.0 - 100.0 fL 83.9  77.4Low   MCH 26.0 - 34.0 pg 26.3  22.8Low   MCHC 30.0 - 36.0 g/dL 44.3  15.4MGQ   RDW 67.6 - 15.5 % 20.3High  17.2High   Platelets 150 - 400 K/uL 324  455High   nRBC 0.0 - 0.2 % 0.0  0.0   Comment: Performed at [facility], [address], [place], [place] [address]  Neutrophils Relative %   73 R   Basophils Absolute   0.0 R   Immature Granulocytes   1 R   Abs Immature Granulocytes   0.06 R, CM   Neutro Abs   8.5High R   Lymphocytes Relative   18 R   Lymphs Abs   2.1 R   Monocytes Relative   8 R   Monocytes Absolute   1.0 R   Eosinophils Relative   0 R   Eosinophils Absolute   0.0 R   Basophils Relative   0 R   Resulting Agency  [facility]         Specimen Collected: 02/07/21 10:17 Last Resulted: 02/07/21 10:34     Lab Flowsheet   Order Details   View Encounter   Lab and Collection Details   Routing   Result History     CM=Additional commentsR=Reference range differs from displayed range     Result Care Coordination   Patient Communication  Add Comments Seen Back to Top        Other Results from 02/07/2021   Vitamin B12 Order: [id number]  Status: Final result   Visible to patient: Yes (seen)   Next appt: 02/22/2021 at 11:45 AM in Oncology ([facility]-MEB INJECTION)   Dx: Iron deficiency anemia secondary to  i...   0 Result Notes  Component Ref Range & Units 2 d ago 1 mo ago  Vitamin B-12 180 - 914 pg/mL 794  265 CM   Comment: (NOTE)  This assay is not validated for testing neonatal or  myeloproliferative syndrome specimens for Vitamin B12 levels.  Performed at [facility], [address] [place], [place]  [address]   Resulting Agency  [facility]         Specimen Collected: 02/07/21 10:17 Last Resulted: 02/07/21 21:14     Lab Flowsheet   Order Details   View Encounter   Lab and Collection Details   Routing   Result History     CM=Additional comments     Result Care Coordination   Patient Communication  Add Comments Seen Back to Top         Ferritin Order: [id number]  Status: Final result    Visible to patient: Yes (seen)    Next appt: 02/22/2021  at 11:45 AM in Oncology ([facility]-MEB INJECTION)    Dx: Iron deficiency anemia secondary to i...    0 Result Notes   Component Ref Range & Units 2 d ago 1 mo ago  Ferritin 11 - 307 ng/mL 48  5Low CM   Comment: Performed at [facility], [address] [place], [place] [address]  Resulting Agency  [facility]          Specimen Collected: 02/07/21 10:17 Last Resulted: 02/07/21 14:08

## 2021-02-10 NOTE — Telephone Encounter (Signed)
Pt aware of results and expressed understanding.  

## 2021-02-10 NOTE — Telephone Encounter (Signed)
Please advise Gloria Mcpherson

## 2021-02-22 ENCOUNTER — Inpatient Hospital Stay: Payer: 59

## 2021-02-22 ENCOUNTER — Other Ambulatory Visit: Payer: Self-pay

## 2021-02-22 ENCOUNTER — Inpatient Hospital Stay: Payer: 59 | Attending: Oncology

## 2021-02-22 DIAGNOSIS — E538 Deficiency of other specified B group vitamins: Secondary | ICD-10-CM | POA: Diagnosis present

## 2021-02-22 DIAGNOSIS — D508 Other iron deficiency anemias: Secondary | ICD-10-CM | POA: Diagnosis not present

## 2021-02-22 MED ORDER — CYANOCOBALAMIN 1000 MCG/ML IJ SOLN
1000.0000 ug | Freq: Once | INTRAMUSCULAR | Status: AC
  Administered 2021-02-22: 1000 ug via INTRAMUSCULAR

## 2021-03-20 ENCOUNTER — Encounter: Payer: Self-pay | Admitting: Emergency Medicine

## 2021-03-20 ENCOUNTER — Other Ambulatory Visit: Payer: Self-pay

## 2021-03-20 ENCOUNTER — Ambulatory Visit
Admission: EM | Admit: 2021-03-20 | Discharge: 2021-03-20 | Disposition: A | Discharge status: Home / Self Care | Payer: 59 | Attending: Physician Assistant | Admitting: Physician Assistant

## 2021-03-20 DIAGNOSIS — Z23 Encounter for immunization: Secondary | ICD-10-CM | POA: Diagnosis not present

## 2021-03-20 DIAGNOSIS — S0181XA Laceration without foreign body of other part of head, initial encounter: Secondary | ICD-10-CM

## 2021-03-20 MED ORDER — TETANUS-DIPHTH-ACELL PERTUSSIS 5-2.5-18.5 LF-MCG/0.5 IM SUSY
0.5000 mL | PREFILLED_SYRINGE | Freq: Once | INTRAMUSCULAR | Status: AC
  Administered 2021-03-20: 0.5 mL via INTRAMUSCULAR

## 2021-03-20 NOTE — Discharge Instructions (Addendum)
Keep wound dry and clean for 2 days, then after that you may wash with soap and water and dab it dry. Don't scrub the area of the cut. The ends of the stiches will fall out on their own ones the suture absorbs.  Watch for signs of infection like redness, hotness or increased pain, if so come back here.  Apply triple antibiotic ointment twice a day for 7 days

## 2021-03-20 NOTE — ED Triage Notes (Signed)
Pt c/o laceration on the left side of her face. She states she fell a couple of hours ago and hit her cheek on a door. Bleeding controled.

## 2021-03-20 NOTE — ED Provider Notes (Addendum)
[facility]    CSN: [id number] Arrival date & time: 03/20/21  1937      History   Chief Complaint Chief Complaint  Patient presents with   Laceration    HPI Gloria Mcpherson is a 45 y.o. female who is here due to lacerating her L face when she fell up steps and his her glasses on her face which was what caused the laceration. She was holding her dog and could not cath herself.  This happened about 4 h ago Does not recall her last TD  Past Medical History:  Diagnosis Date   Anemia    Anxiety    Hypertension    Lupus (HCC)    Lupus (HCC) 2009   Osteoporosis     Patient Active Problem List   Diagnosis Date Noted   B12 deficiency 12/20/2020   Iron deficiency anemia 12/13/2020   Anemia 12/12/2020    Past Surgical History:  Procedure Laterality Date   BACK SURGERY     2019 had a laminectomy   CESAREAN SECTION  07/2014   CHOLECYSTECTOMY     CHOLECYSTECTOMY  05/2020    OB History   No obstetric history on file.      Home Medications    Prior to Admission medications   Medication Sig Start Date End Date Taking? Authorizing Provider  acetaminophen (TYLENOL) 500 MG tablet Take 500 mg by mouth every 6 (six) hours as needed.   Yes [provider]  alendronate (FOSAMAX) 70 MG tablet Take 70 mg by mouth.   Yes [provider]  amitriptyline (ELAVIL) 75 MG tablet Take 75 mg by mouth. 12/04/13  Yes [provider]  amLODIPine-Valsartan-HCTZ 10-160-12.5 MG TABS    Yes [provider]  Digestive Enzymes TABS Take by mouth daily.   Yes [provider]  dronabinol (MARINOL) 5 MG capsule Take by mouth. Patient takes twice a day   Yes [provider]  famotidine (PEPCID) 40 MG tablet Take by mouth. 09/28/20  Yes [provider]  hydroxychloroquine (PLAQUENIL) 200 MG tablet Take 200 mg by mouth daily. 12/18/13  Yes [provider]  LORazepam (ATIVAN) 1 MG tablet Take 1 mg by mouth as  needed. 01/26/16  Yes [provider]  predniSONE (DELTASONE) 10 MG tablet Take 5 mg by mouth at bedtime as needed (take 5-20mg , as needed).   Yes [provider]  prochlorperazine (COMPAZINE) 5 MG tablet TAKE 2 TABLETS EVERY 8 HOURS AS NEEDED FOR NAUSEA 09/12/16  Yes [provider]  RABEprazole (ACIPHEX) 20 MG tablet Take 20 mg by mouth daily. 01/04/16  Yes [provider]  aspirin EC 81 MG tablet Take 81 mg by mouth. Patient not taking: Reported on 12/20/2020    [provider]  dicyclomine (BENTYL) 10 MG capsule Take 10 mg by mouth every 6 (six) hours as needed. 11/23/20   [provider]  naproxen sodium (ALEVE) 220 MG tablet Take by mouth. Patient not taking: Reported on 12/20/2020    [provider]  NON FORMULARY Take by mouth 3 (three) times daily with meals.  Patient not taking: Reported on 12/20/2020    [provider]    Family History Family History  Problem Relation Age of Onset   Hypertension Mother    Hypertension Paternal Uncle    Diabetes Maternal Grandmother     Social History Social History   Tobacco Use   Smoking status: Never Smoker   Smokeless tobacco: Never Used  Substance  Use Topics   Alcohol use: Yes    Alcohol/week: 1.0 standard drink    Types: 1 Glasses of wine per week   Drug use: Never     Allergies   Cat hair extract   Review of Systems Review of Systems  Eyes: Negative for pain and redness.  Musculoskeletal: Negative for neck pain.  Skin: Positive for wound.  Neurological: Negative for syncope and headaches.     Physical Exam Triage Vital Signs ED Triage Vitals [03/20/21 1953]  Enc Vitals Group     BP (!) 129/109     Pulse Rate (!) 125     Resp 18     Temp 98.4 F (36.9 C)     Temp Source Oral     SpO2 100 %     Weight 185 lb (83.9 kg)     Height 5\' 3"  (1.6 m)     Head Circumference      Peak Flow      Pain Score 2     Pain Loc      Pain Edu?      Excl. in  GC?    No data found.  Updated Vital Signs BP (!) 129/109 (BP Location: Right Arm)   Pulse (!) 125 Comment: pt states she is nervous  Temp 98.4 F (36.9 C) (Oral)   Resp 18   Ht 5\' 3"  (1.6 m)   Wt 185 lb (83.9 kg)   SpO2 100%   BMI 32.77 kg/m   Visual Acuity Right Eye Distance:   Left Eye Distance:   Bilateral Distance:    Right Eye Near:   Left Eye Near:    Bilateral Near:     Physical Exam Vitals and nursing note reviewed.  Constitutional:      General: She is not in acute distress.    Appearance: She is obese. She is not toxic-appearing.  HENT:     Head:     Comments: FACE with deep laceration on L cheek about 2 cm length with irregular edges.     Right Ear: External ear normal.     Left Ear: External ear normal.  Eyes:     General: No scleral icterus.    Extraocular Movements: Extraocular movements intact.     Conjunctiva/sclera: Conjunctivae normal.     Pupils: Pupils are equal, round, and reactive to light.  Pulmonary:     Effort: Pulmonary effort is normal.  Musculoskeletal:        General: Normal range of motion.     Cervical back: Neck supple.  Skin:    General: Skin is warm and dry.     Findings: No rash.  Neurological:     Mental Status: She is alert and oriented to person, place, and time.     Gait: Gait normal.  Psychiatric:        Mood and Affect: Mood normal.        Behavior: Behavior normal.        Thought Content: Thought content normal.        Judgment: Judgment normal.      UC Treatments / Results  Labs (all labs ordered are listed, but only abnormal results are displayed) Labs Reviewed - No data to display  EKG   Radiology No results found.  Procedures Laceration Repair  Date/Time: 03/20/2021 8:42 PM Performed by: , PA-C Authorized by: 05/20/2021, PA-C   Consent:    Consent obtained:  Verbal   Consent given by:  Patient   Risks, benefits, and alternatives were discussed  comment:  She has had stiches before Universal protocol:    Procedure explained and questions answered to patient or proxy's satisfaction: yes     Patient identity confirmed:  Verbally with patient Laceration details:    Location:  Face   Face location:  L cheek   Length (cm):  2   Depth (mm):  4 Pre-procedure details:    Preparation:  Patient was prepped and draped in usual sterile fashion Exploration:    Hemostasis achieved with:  Direct pressure   Imaging outcome: foreign body not noted     Wound exploration: entire depth of wound visualized     Contaminated: no   Treatment:    Area cleansed with:  Soap and water   Amount of cleaning:  Standard   Irrigation volume:  Qtips used   Debridement:  None   Layers/structures repaired:  Deep subcutaneous Deep subcutaneous:    Suture size:  6-0   Suture material:  Vicryl   Suture technique:  Figure eight   Number of sutures:  1 (one dep) Skin repair:    Repair method:  Sutures   Suture size:  6-0   Suture technique:  Running Approximation:    Approximation:  Close Repair type:    Repair type:  Intermediate Post-procedure details:    Dressing:  Antibiotic ointment (including critical care time)  Medications Ordered in UC Medications  Tdap (BOOSTRIX) injection 0.5 mL (0.5 mLs Intramuscular Given 03/20/21 2001)    Initial Impression / Assessment and Plan / UC Course  I have reviewed the triage vital signs and the nursing notes. L face laceration TDAP given Wound care instructed. See instructions     Final Clinical Impressions(s) / UC Diagnoses   Final diagnoses:  Facial laceration, initial encounter     Discharge Instructions      Keep wound dry and clean for 2 days, then after that you may wash with soap and water and dab it dry. Don't scrub the area of the cut. The ends of the stiches will fall out on their own ones the suture absorbs.  Watch for signs of infection like redness, hotness or increased pain, if so  come back here.  Apply triple antibiotic ointment twice a day for 7 days    ED Prescriptions     None      PDMP not reviewed this encounter.   Garey Ham, Cordelia Poche 03/20/21 2047    Rodriguez-Southworth, Nettie Elm, PA-C 03/25/21 [phone]

## 2021-03-22 ENCOUNTER — Other Ambulatory Visit (HOSPITAL_COMMUNITY)
Admission: RE | Admit: 2021-03-22 | Discharge: 2021-03-22 | Disposition: A | Discharge status: Home / Self Care | Payer: 59 | Source: Ambulatory Visit | Attending: Family Medicine | Admitting: Family Medicine

## 2021-03-22 ENCOUNTER — Other Ambulatory Visit: Payer: Self-pay | Admitting: Family Medicine

## 2021-03-22 DIAGNOSIS — Z124 Encounter for screening for malignant neoplasm of cervix: Secondary | ICD-10-CM | POA: Diagnosis present

## 2021-03-24 LAB — CYTOLOGY - PAP
Comment: NEGATIVE
Diagnosis: NEGATIVE
High risk HPV: NEGATIVE

## 2021-03-27 ENCOUNTER — Inpatient Hospital Stay: Payer: 59

## 2021-03-29 ENCOUNTER — Inpatient Hospital Stay: Payer: 59

## 2021-03-30 ENCOUNTER — Other Ambulatory Visit: Payer: Self-pay

## 2021-03-30 ENCOUNTER — Inpatient Hospital Stay: Payer: 59 | Attending: Oncology

## 2021-03-30 DIAGNOSIS — D508 Other iron deficiency anemias: Secondary | ICD-10-CM

## 2021-03-30 DIAGNOSIS — E538 Deficiency of other specified B group vitamins: Secondary | ICD-10-CM | POA: Insufficient documentation

## 2021-03-30 MED ORDER — IRON SUCROSE 20 MG/ML IV SOLN: 200.0000 mg | Freq: Once | INTRAVENOUS | Status: DC

## 2021-03-30 MED ORDER — SODIUM CHLORIDE 0.9 % IV SOLN: 200.0000 mg | Freq: Once | INTRAVENOUS | Status: DC

## 2021-03-30 MED ORDER — CYANOCOBALAMIN 1000 MCG/ML IJ SOLN
1000.0000 ug | Freq: Once | INTRAMUSCULAR | Status: AC
  Administered 2021-03-30: 1000 ug via INTRAMUSCULAR

## 2021-04-25 ENCOUNTER — Other Ambulatory Visit: Payer: Self-pay

## 2021-04-25 ENCOUNTER — Other Ambulatory Visit: Payer: 59

## 2021-04-25 DIAGNOSIS — D508 Other iron deficiency anemias: Secondary | ICD-10-CM

## 2021-04-25 DIAGNOSIS — E538 Deficiency of other specified B group vitamins: Secondary | ICD-10-CM

## 2021-04-26 ENCOUNTER — Ambulatory Visit: Payer: 59 | Admitting: Oncology

## 2021-04-26 ENCOUNTER — Ambulatory Visit: Payer: 59

## 2021-04-27 ENCOUNTER — Inpatient Hospital Stay: Payer: 59 | Attending: Oncology

## 2021-04-27 ENCOUNTER — Other Ambulatory Visit: Payer: Self-pay

## 2021-04-27 DIAGNOSIS — D509 Iron deficiency anemia, unspecified: Secondary | ICD-10-CM | POA: Diagnosis not present

## 2021-04-27 DIAGNOSIS — N920 Excessive and frequent menstruation with regular cycle: Secondary | ICD-10-CM | POA: Insufficient documentation

## 2021-04-27 DIAGNOSIS — D508 Other iron deficiency anemias: Secondary | ICD-10-CM

## 2021-04-27 LAB — CBC WITH DIFFERENTIAL/PLATELET
Abs Immature Granulocytes: 0.05 10*3/uL (ref 0.00–0.07)
Basophils Absolute: 0 10*3/uL (ref 0.0–0.1)
Basophils Relative: 1 %
Eosinophils Absolute: 0.1 10*3/uL (ref 0.0–0.5)
Eosinophils Relative: 1 %
HCT: 36.2 % (ref 36.0–46.0)
Hemoglobin: 11.7 g/dL — ABNORMAL LOW (ref 12.0–15.0)
Immature Granulocytes: 1 %
Lymphocytes Relative: 17 %
Lymphs Abs: 1.4 10*3/uL (ref 0.7–4.0)
MCH: 27.9 pg (ref 26.0–34.0)
MCHC: 32.3 g/dL (ref 30.0–36.0)
MCV: 86.4 fL (ref 80.0–100.0)
Monocytes Absolute: 0.7 10*3/uL (ref 0.1–1.0)
Monocytes Relative: 9 %
Neutro Abs: 5.6 10*3/uL (ref 1.7–7.7)
Neutrophils Relative %: 71 %
Platelets: 353 10*3/uL (ref 150–400)
RBC: 4.19 MIL/uL (ref 3.87–5.11)
RDW: 14.3 % (ref 11.5–15.5)
WBC: 7.8 10*3/uL (ref 4.0–10.5)
nRBC: 0 % (ref 0.0–0.2)

## 2021-04-27 LAB — IRON AND TIBC
Iron: 40 ug/dL (ref 28–170)
Saturation Ratios: 11 % (ref 10.4–31.8)
TIBC: 370 ug/dL (ref 250–450)
UIBC: 330 ug/dL

## 2021-04-27 LAB — FERRITIN: Ferritin: 34 ng/mL (ref 11–307)

## 2021-04-28 ENCOUNTER — Inpatient Hospital Stay: Payer: 59

## 2021-04-28 ENCOUNTER — Inpatient Hospital Stay (HOSPITAL_BASED_OUTPATIENT_CLINIC_OR_DEPARTMENT_OTHER): Payer: 59 | Admitting: Nurse Practitioner

## 2021-04-28 ENCOUNTER — Encounter: Payer: Self-pay | Admitting: Nurse Practitioner

## 2021-04-28 VITALS — BP 112/90 | HR 99 | Temp 98.2°F | Resp 18 | Wt 194.0 lb

## 2021-04-28 VITALS — BP 117/85 | HR 87

## 2021-04-28 DIAGNOSIS — D508 Other iron deficiency anemias: Secondary | ICD-10-CM | POA: Diagnosis not present

## 2021-04-28 DIAGNOSIS — E538 Deficiency of other specified B group vitamins: Secondary | ICD-10-CM

## 2021-04-28 DIAGNOSIS — D509 Iron deficiency anemia, unspecified: Secondary | ICD-10-CM | POA: Diagnosis not present

## 2021-04-28 MED ORDER — IRON SUCROSE 20 MG/ML IV SOLN
200.0000 mg | Freq: Once | INTRAVENOUS | Status: AC
  Administered 2021-04-28: 200 mg via INTRAVENOUS

## 2021-04-28 MED ORDER — SODIUM CHLORIDE 0.9 % IV SOLN
Freq: Once | INTRAVENOUS | Status: AC
  Filled 2021-04-28: qty 250

## 2021-04-28 MED ORDER — SODIUM CHLORIDE 0.9 % IV SOLN: 200.0000 mg | Freq: Once | INTRAVENOUS | Status: DC

## 2021-04-28 NOTE — Progress Notes (Addendum)
[facility]  [address], [address] [place], [place] [address] Phone: [phone]  Fax: [phone]   Clinic Day:  04/28/2021  Referring physician: Deatra James, MD  Chief Complaint: Gloria Mcpherson is a 45 y.o. female with anemia who returns to clinic for routine follow up  HPI: Gloria Mcpherson is a 45 y.o. female with iron deficiency anemia secondary to diet and chronic GI symptoms.  MCV became microcytic on 04/21/2020.  Labs on 11/30/2020 revealed a hematocrit was 31.7, hemoglobin 9.2, MCV 78.7, platelets 426,000, WBC 8,600.  Ferritin was 30 on 06/21/2014. Iron saturation was 5% and TIBC was 532 on 06/17/2014.    Work-up on 12/13/2020 revealed a hematocrit of 33.6, hemoglobin 9.9, MCV 77.4, platelets 455,000, WBC 11,700.  Ferritin was 5 with an iron saturation of 3% and a TIBC of 500. Vitamin B12 was 265 and folate 9.7. CRP was 1.0 and sed rate 35.  Last colonoscopy was in high school.  EGD in 2020 is unavailable.  She is followed by Dr Beverly Gust in gastroenterology at [facility].  B12 has been followed:  667 on 06/17/2014, 478 on 02/03/2018, and 265 on 12/13/2020.  She has lupus and Sjogren's. She is on Plaquenil 200 mg BID and prednisone 5 mg daily.   The patient received the Moderna Booster on 08/11/2020.   Interval History: Patient with above history of iron deficiency anemia and B12 anemia who returns to clinic for further evaluation and consideration of IV Venofer.  In March and receives monthly B12.  Continues to have fatigue, leg cramps.  Shortness of breath with exertion. Denies any neurologic complaints. Denies recent fevers or illnesses. Denies any easy bleeding or bruising. No melena or hematochezia. Reports good appetite and denies weight loss. Denies chest pain. Denies any nausea, vomiting, constipation, or diarrhea. Denies urinary complaints. Patient offers no further specific complaints  today.    Past Medical History:  Diagnosis Date   Anemia    Anxiety    Hypertension    Lupus (HCC)    Lupus (HCC) 2009   Osteoporosis     Past Surgical History:  Procedure Laterality Date   BACK SURGERY     2019 had a laminectomy   CESAREAN SECTION  07/2014   CHOLECYSTECTOMY     CHOLECYSTECTOMY  05/2020    Family History  Problem Relation Age of Onset   Hypertension Mother    Hypertension Paternal Uncle    Diabetes Maternal Grandmother     Social History:  reports that she has never smoked. She has never used smokeless tobacco. She reports current alcohol use of about 1.0 standard drink of alcohol per week. She reports that she does not use drugs. She has two glasses of wine per week. She denies tobacco use. She has not been exposed to radiation or toxins. She lives in [place]. Her family is from [place]. She has lived in the [place] her whole life and spent summers in [place]. 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  Allergies:  Allergies  Allergen Reactions   Cat Hair Extract Itching    Current Medications: Current Outpatient Medications  Medication Sig Dispense Refill   acetaminophen (TYLENOL) 500 MG tablet Take 500 mg by mouth every 6 (six) hours as needed.     alendronate (FOSAMAX) 70 MG tablet Take 70 mg by mouth.     amitriptyline (ELAVIL) 75 MG tablet Take 75 mg by mouth.  amLODIPine-Valsartan-HCTZ 10-160-12.5 MG TABS      dicyclomine (BENTYL) 10 MG capsule Take 10 mg by mouth every 6 (six) hours as needed.     Digestive Enzymes TABS Take by mouth daily.     dronabinol (MARINOL) 5 MG capsule Take by mouth. Patient takes twice a day     escitalopram (LEXAPRO) 10 MG tablet Take 15 mg by mouth daily. 1 and 1/2 tabs daily     famotidine (PEPCID) 40 MG tablet Take by mouth.     hydroxychloroquine (PLAQUENIL) 200 MG tablet Take 200 mg by mouth daily.     LORazepam (ATIVAN) 1 MG tablet Take 1 mg by mouth as  needed.     predniSONE (DELTASONE) 10 MG tablet Take 5 mg by mouth at bedtime as needed (take 5-20mg , as needed).     prochlorperazine (COMPAZINE) 5 MG tablet TAKE 2 TABLETS EVERY 8 HOURS AS NEEDED FOR NAUSEA     RABEprazole (ACIPHEX) 20 MG tablet Take 20 mg by mouth daily.     aspirin EC 81 MG tablet Take 81 mg by mouth. (Patient not taking: Reported on 04/28/2021)     naproxen sodium (ALEVE) 220 MG tablet Take by mouth. (Patient not taking: No sig reported)     NON FORMULARY Take by mouth 3 (three) times daily with meals.  (Patient not taking: No sig reported)     No current facility-administered medications for this visit.    Review of Systems  Constitutional:  Positive for malaise/fatigue. Negative for chills, diaphoresis, fever and weight loss (3 lbs).       Feels "tired."  HENT:  Negative for congestion, ear discharge, ear pain, hearing loss, nosebleeds, sinus pain, sore throat and tinnitus.   Eyes:  Negative for blurred vision.  Respiratory:  Negative for cough, hemoptysis, sputum production and shortness of breath.   Cardiovascular:  Negative for chest pain, palpitations and leg swelling.  Gastrointestinal:  Positive for abdominal pain and nausea. Negative for blood in stool, constipation, diarrhea, heartburn, melena and vomiting.  Genitourinary:  Negative for dysuria, frequency, hematuria and urgency.  Musculoskeletal:  Positive for back pain and joint pain (knees, shoulders, elbows, wrists). Negative for myalgias and neck pain.  Skin:  Negative for itching and rash.  Neurological:  Positive for dizziness (when she stands up). Negative for tingling, sensory change, weakness and headaches.  Endo/Heme/Allergies:  Does not bruise/bleed easily.  Psychiatric/Behavioral:  Negative for depression and memory loss. The patient is not nervous/anxious and does not have insomnia.   All other systems reviewed and are negative. Performance status (ECOG): 1  Vitals Blood pressure 112/90, pulse  99, temperature 98.2 F (36.8 C), resp. rate 18, weight 194 lb 0.1 oz (88 kg), SpO2 100 %.   General: Well-developed, well-nourished, no acute distress. Eyes: Pink conjunctiva, anicteric sclera. Lungs: Clear to auscultation bilaterally.  No audible wheezing or coughing Heart: Regular rate and rhythm.  Abdomen: Soft, nontender, nondistended.  Musculoskeletal: No edema, cyanosis, or clubbing. Neuro: Alert, answering all questions appropriately. Cranial nerves grossly intact. Skin: No rashes or petechiae noted. Psych: Normal affect.   Appointment on 04/27/2021  Component Date Value Ref Range Status   Iron 04/27/2021 40  28 - 170 ug/dL Final   TIBC 66/03/3015 370  250 - 450 ug/dL Final   Saturation Ratios 04/27/2021 11  10.4 - 31.8 % Final   UIBC 04/27/2021 330  ug/dL Final   Performed at [facility], [address] [place], [place] [address]   Ferritin 04/27/2021  34  11 - 307 ng/mL Final   Performed at [facility], [address] [place], [place] [address]   WBC 04/27/2021 7.8  4.0 - 10.5 K/uL Final   RBC 04/27/2021 4.19  3.87 - 5.11 MIL/uL Final   Hemoglobin 04/27/2021 11.7 (A) 12.0 - 15.0 g/dL Final   HCT 66/29/4765 36.2  36.0 - 46.0 % Final   MCV 04/27/2021 86.4  80.0 - 100.0 fL Final   MCH 04/27/2021 27.9  26.0 - 34.0 pg Final   MCHC 04/27/2021 32.3  30.0 - 36.0 g/dL Final   RDW 46/50/3546 14.3  11.5 - 15.5 % Final   Platelets 04/27/2021 353  150 - 400 K/uL Final   nRBC 04/27/2021 0.0  0.0 - 0.2 % Final   Neutrophils Relative % 04/27/2021 71  % Final   Neutro Abs 04/27/2021 5.6  1.7 - 7.7 K/uL Final   Lymphocytes Relative 04/27/2021 17  % Final   Lymphs Abs 04/27/2021 1.4  0.7 - 4.0 K/uL Final   Monocytes Relative 04/27/2021 9  % Final   Monocytes Absolute 04/27/2021 0.7  0.1 - 1.0 K/uL Final   Eosinophils Relative 04/27/2021 1  % Final   Eosinophils Absolute 04/27/2021 0.1  0.0 - 0.5 K/uL Final   Basophils Relative 04/27/2021 1  % Final   Basophils  Absolute 04/27/2021 0.0  0.0 - 0.1 K/uL Final   Immature Granulocytes 04/27/2021 1  % Final   Abs Immature Granulocytes 04/27/2021 0.05  0.00 - 0.07 K/uL Final   Performed at [facility], [address], [place], [place] [address]    Assessment:   1.   Iron deficiency anemia- etiology questionable but suspect related to heavy menses. Hemoglobin 11.7, hematocrit 36.2. Normocytic. Improved. Ferritin 34. Iron saturation 11%. Improved, however persistent symptoms. Improved with venofer previously. Recommend venofer x 2.   2.   B12 deficiency  B12 was 265 on 12/13/2020. Now 794. Continue monthly b12.   Plan: Venofer today Repeat in 1 week 3 months- lab (cbc, ferritin, iron studies, b12) Day or 2 later- MD to establish care, poss venofer- la   I discussed the assessment and treatment plan with the patient.  The patient was provided an opportunity to ask questions and all were answered.  The patient agreed with the plan and demonstrated an understanding of the instructions.  The patient was advised to call back if the symptoms worsen or if the condition fails to improve as anticipated.  Consuello Masse, DNP, AGNP-C [facility] [phone] (clinic)

## 2021-05-05 ENCOUNTER — Other Ambulatory Visit: Payer: Self-pay

## 2021-05-05 ENCOUNTER — Inpatient Hospital Stay: Payer: 59

## 2021-05-05 VITALS — BP 120/90 | HR 92 | Temp 98.2°F | Resp 16

## 2021-05-05 DIAGNOSIS — D509 Iron deficiency anemia, unspecified: Secondary | ICD-10-CM | POA: Diagnosis not present

## 2021-05-05 DIAGNOSIS — D508 Other iron deficiency anemias: Secondary | ICD-10-CM

## 2021-05-05 MED ORDER — SODIUM CHLORIDE 0.9 % IV SOLN: 200.0000 mg | Freq: Once | INTRAVENOUS | Status: DC

## 2021-05-05 MED ORDER — CYANOCOBALAMIN 1000 MCG/ML IJ SOLN
1000.0000 ug | Freq: Once | INTRAMUSCULAR | Status: AC
  Administered 2021-05-05: 1000 ug via INTRAMUSCULAR
  Filled 2021-05-05: qty 1

## 2021-05-05 MED ORDER — IRON SUCROSE 20 MG/ML IV SOLN
200.0000 mg | Freq: Once | INTRAVENOUS | Status: AC
  Administered 2021-05-05: 200 mg via INTRAVENOUS
  Filled 2021-05-05: qty 10

## 2021-05-05 MED ORDER — SODIUM CHLORIDE 0.9 % IV SOLN
Freq: Once | INTRAVENOUS | Status: AC
  Filled 2021-05-05: qty 250

## 2021-05-08 ENCOUNTER — Encounter: Payer: Self-pay | Admitting: Hematology and Oncology

## 2021-06-02 ENCOUNTER — Inpatient Hospital Stay: Payer: 59 | Attending: Nurse Practitioner

## 2021-06-02 ENCOUNTER — Inpatient Hospital Stay: Payer: 59

## 2021-06-02 ENCOUNTER — Other Ambulatory Visit: Payer: Self-pay

## 2021-06-02 DIAGNOSIS — D508 Other iron deficiency anemias: Secondary | ICD-10-CM

## 2021-06-02 DIAGNOSIS — E538 Deficiency of other specified B group vitamins: Secondary | ICD-10-CM | POA: Diagnosis not present

## 2021-06-02 MED ORDER — CYANOCOBALAMIN 1000 MCG/ML IJ SOLN
1000.0000 ug | Freq: Once | INTRAMUSCULAR | Status: AC
  Administered 2021-06-02: 1000 ug via INTRAMUSCULAR

## 2021-06-30 ENCOUNTER — Inpatient Hospital Stay: Payer: 59 | Attending: Nurse Practitioner

## 2021-06-30 DIAGNOSIS — E538 Deficiency of other specified B group vitamins: Secondary | ICD-10-CM | POA: Insufficient documentation

## 2021-06-30 DIAGNOSIS — D508 Other iron deficiency anemias: Secondary | ICD-10-CM

## 2021-06-30 MED ORDER — CYANOCOBALAMIN 1000 MCG/ML IJ SOLN
1000.0000 ug | Freq: Once | INTRAMUSCULAR | Status: AC
  Administered 2021-06-30: 1000 ug via INTRAMUSCULAR

## 2021-07-25 ENCOUNTER — Inpatient Hospital Stay: Payer: 59 | Attending: Oncology

## 2021-07-25 ENCOUNTER — Other Ambulatory Visit: Payer: Self-pay

## 2021-07-25 DIAGNOSIS — E538 Deficiency of other specified B group vitamins: Secondary | ICD-10-CM | POA: Insufficient documentation

## 2021-07-25 DIAGNOSIS — D508 Other iron deficiency anemias: Secondary | ICD-10-CM | POA: Diagnosis present

## 2021-07-25 DIAGNOSIS — Z79899 Other long term (current) drug therapy: Secondary | ICD-10-CM | POA: Insufficient documentation

## 2021-07-25 LAB — CBC WITH DIFFERENTIAL/PLATELET
Abs Immature Granulocytes: 0.07 10*3/uL (ref 0.00–0.07)
Basophils Absolute: 0 10*3/uL (ref 0.0–0.1)
Basophils Relative: 0 %
Eosinophils Absolute: 0 10*3/uL (ref 0.0–0.5)
Eosinophils Relative: 0 %
HCT: 36.6 % (ref 36.0–46.0)
Hemoglobin: 12.2 g/dL (ref 12.0–15.0)
Immature Granulocytes: 1 %
Lymphocytes Relative: 12 %
Lymphs Abs: 1.3 10*3/uL (ref 0.7–4.0)
MCH: 29.3 pg (ref 26.0–34.0)
MCHC: 33.3 g/dL (ref 30.0–36.0)
MCV: 87.8 fL (ref 80.0–100.0)
Monocytes Absolute: 0.9 10*3/uL (ref 0.1–1.0)
Monocytes Relative: 8 %
Neutro Abs: 8.5 10*3/uL — ABNORMAL HIGH (ref 1.7–7.7)
Neutrophils Relative %: 79 %
Platelets: 343 10*3/uL (ref 150–400)
RBC: 4.17 MIL/uL (ref 3.87–5.11)
RDW: 13.6 % (ref 11.5–15.5)
WBC: 10.8 10*3/uL — ABNORMAL HIGH (ref 4.0–10.5)
nRBC: 0 % (ref 0.0–0.2)

## 2021-07-25 LAB — IRON AND TIBC
Iron: 36 ug/dL (ref 28–170)
Saturation Ratios: 10 % — ABNORMAL LOW (ref 10.4–31.8)
TIBC: 350 ug/dL (ref 250–450)
UIBC: 314 ug/dL

## 2021-07-25 LAB — FERRITIN: Ferritin: 46 ng/mL (ref 11–307)

## 2021-07-26 ENCOUNTER — Ambulatory Visit: Payer: 59

## 2021-07-26 ENCOUNTER — Ambulatory Visit: Payer: 59 | Admitting: Oncology

## 2021-07-26 LAB — VITAMIN B12: Vitamin B-12: 618 pg/mL (ref 180–914)

## 2021-07-28 ENCOUNTER — Ambulatory Visit: Payer: 59

## 2021-07-28 ENCOUNTER — Telehealth: Payer: 59 | Admitting: Oncology

## 2021-08-02 ENCOUNTER — Other Ambulatory Visit: Payer: Self-pay

## 2021-08-02 ENCOUNTER — Inpatient Hospital Stay: Payer: 59

## 2021-08-02 ENCOUNTER — Inpatient Hospital Stay: Payer: 59 | Admitting: Oncology

## 2021-08-02 VITALS — BP 135/94 | HR 99 | Temp 97.0°F | Resp 18

## 2021-08-02 DIAGNOSIS — D508 Other iron deficiency anemias: Secondary | ICD-10-CM

## 2021-08-02 DIAGNOSIS — E538 Deficiency of other specified B group vitamins: Secondary | ICD-10-CM | POA: Diagnosis not present

## 2021-08-02 MED ORDER — SYRINGE 25G X 1" 3 ML MISC: 1.0000 mL | 0 refills | Status: DC

## 2021-08-02 MED ORDER — CYANOCOBALAMIN 1000 MCG/ML IJ SOLN
1000.0000 ug | Freq: Once | INTRAMUSCULAR | 0 refills | Status: AC
Start: 2021-08-02 — End: 2021-08-02

## 2021-08-02 MED ORDER — IRON SUCROSE 20 MG/ML IV SOLN
200.0000 mg | Freq: Once | INTRAVENOUS | Status: AC
  Administered 2021-08-02: 200 mg via INTRAVENOUS

## 2021-08-02 MED ORDER — SODIUM CHLORIDE 0.9 % IV SOLN
Freq: Once | INTRAVENOUS | Status: AC
  Filled 2021-08-02: qty 250

## 2021-08-02 MED ORDER — SODIUM CHLORIDE 0.9 % IV SOLN: 200.0000 mg | INTRAVENOUS | Status: DC

## 2021-08-02 NOTE — Progress Notes (Signed)
I connected with Gloria Mcpherson on 08/02/21 at  1:00 PM EDT by video enabled telemedicine visit and verified that I am speaking with the correct person using two identifiers.   I discussed the limitations, risks, security and privacy concerns of performing an evaluation and management service by telemedicine and the availability of in-person appointments. I also discussed with the patient that there may be a patient responsible charge related to this service. The patient expressed understanding and agreed to proceed.  Other persons participating in the visit and their role in the encounter:  none  Patient's location:  home Provider's location:  work  Stage manager Complaint: Routine follow-up of iron deficiency anemia  History of present illness: Patient is a 45 year old female with history of iron and B12 deficiency anemia.  She has received Venofer in the past.  Because of iron deficiency has been attributed to heavy menses in the past.  EGD was about 2 years ago and colonoscopy was several years ago.  History of lupus and Sjogren's.  Interval history patient currently reports ongoing fatigue.  She denies any blood loss in her stool or urine.  Reports that her menstrual cycles are no longer heavy and she gets them about 2 or 3 times a year   Review of Systems  Constitutional:  Positive for malaise/fatigue. Negative for chills, fever and weight loss.  HENT:  Negative for congestion, ear discharge and nosebleeds.   Eyes:  Negative for blurred vision.  Respiratory:  Negative for cough, hemoptysis, sputum production, shortness of breath and wheezing.   Cardiovascular:  Negative for chest pain, palpitations, orthopnea and claudication.  Gastrointestinal:  Negative for abdominal pain, blood in stool, constipation, diarrhea, heartburn, melena, nausea and vomiting.  Genitourinary:  Negative for dysuria, flank pain, frequency, hematuria and urgency.  Musculoskeletal:  Negative for back pain,  joint pain and myalgias.  Skin:  Negative for rash.  Neurological:  Negative for dizziness, tingling, focal weakness, seizures, weakness and headaches.  Endo/Heme/Allergies:  Does not bruise/bleed easily.  Psychiatric/Behavioral:  Negative for depression and suicidal ideas. The patient does not have insomnia.    Allergies  Allergen Reactions   Cat Hair Extract Itching    Past Medical History:  Diagnosis Date   Anemia    Anxiety    Hypertension    Lupus (HCC)    Lupus (HCC) 2009   Osteoporosis     Past Surgical History:  Procedure Laterality Date   BACK SURGERY     2019 had a laminectomy   CESAREAN SECTION  07/2014   CHOLECYSTECTOMY     CHOLECYSTECTOMY  05/2020    Social History   Socioeconomic History   Marital status: Married    Spouse name: Not on file   Number of children: Not on file   Years of education: Not on file   Highest education level: Not on file  Occupational History   Not on file  Tobacco Use   Smoking status: Never   Smokeless tobacco: Never  Vaping Use   Vaping Use: Not on file  Substance and Sexual Activity   Alcohol use: Yes    Alcohol/week: 1.0 standard drink    Types: 1 Glasses of wine per week   Drug use: Never   Sexual activity: Yes  Other Topics Concern   Not on file  Social History Narrative   Not on file   Social Determinants of Health   Financial Resource Strain: Not on file  Food Insecurity: Not on file  Transportation Needs: Not  on file  Physical Activity: Not on file  Stress: Not on file  Social Connections: Not on file  Intimate Partner Violence: Not on file    Family History  Problem Relation Age of Onset   Hypertension Mother    Hypertension Paternal Uncle    Diabetes Maternal Grandmother      Current Outpatient Medications:    amLODIPine-Valsartan-HCTZ 10-160-12.5 MG TABS, , Disp: , Rfl:    cyanocobalamin (,VITAMIN B-12,) 1000 MCG/ML injection, Inject 1 mL (1,000 mcg total) into the muscle once for 1 dose.,  Disp: 1 mL, Rfl: 0   dicyclomine (BENTYL) 10 MG capsule, Take 10 mg by mouth every 6 (six) hours as needed., Disp: , Rfl:    Digestive Enzymes TABS, Take by mouth daily., Disp: , Rfl:    dronabinol (MARINOL) 5 MG capsule, Take by mouth. Patient takes twice a day, Disp: , Rfl:    escitalopram (LEXAPRO) 10 MG tablet, Take 15 mg by mouth daily. 1 and 1/2 tabs daily, Disp: , Rfl:    famotidine (PEPCID) 40 MG tablet, Take by mouth., Disp: , Rfl:    hydroxychloroquine (PLAQUENIL) 200 MG tablet, Take 200 mg by mouth daily., Disp: , Rfl:    LORazepam (ATIVAN) 1 MG tablet, Take 1 mg by mouth as needed., Disp: , Rfl:    predniSONE (DELTASONE) 10 MG tablet, Take 5 mg by mouth at bedtime as needed (take 5-20mg , as needed)., Disp: , Rfl:    RABEprazole (ACIPHEX) 20 MG tablet, Take 20 mg by mouth daily., Disp: , Rfl:    Syringe/Needle, Disp, (SYRINGE 3CC/25GX1") 25G X 1" 3 ML MISC, 1 mL by Does not apply route every 30 (thirty) days., Disp: 5 each, Rfl: 0   acetaminophen (TYLENOL) 500 MG tablet, Take 500 mg by mouth every 6 (six) hours as needed., Disp: , Rfl:    alendronate (FOSAMAX) 70 MG tablet, Take 70 mg by mouth., Disp: , Rfl:    amitriptyline (ELAVIL) 100 MG tablet, Take 100 mg by mouth at bedtime., Disp: , Rfl:    amitriptyline (ELAVIL) 75 MG tablet, Take 75 mg by mouth., Disp: , Rfl:    amLODipine-valsartan (EXFORGE) 5-320 MG tablet, Take 1 tablet by mouth daily., Disp: , Rfl:    aspirin EC 81 MG tablet, Take 81 mg by mouth. (Patient not taking: No sig reported), Disp: , Rfl:    benzonatate (TESSALON) 100 MG capsule, Take 100-200 mg by mouth 3 (three) times daily as needed. (Patient not taking: Reported on 08/02/2021), Disp: , Rfl:    HYDROcodone-acetaminophen (NORCO/VICODIN) 5-325 MG tablet, Take 1 tablet by mouth every 6 (six) hours as needed. (Patient not taking: Reported on 08/02/2021), Disp: , Rfl:    naproxen sodium (ALEVE) 220 MG tablet, Take by mouth. (Patient not taking: No sig reported),  Disp: , Rfl:    NON FORMULARY, Take by mouth 3 (three) times daily with meals.  (Patient not taking: No sig reported), Disp: , Rfl:    prochlorperazine (COMPAZINE) 5 MG tablet, TAKE 2 TABLETS EVERY 8 HOURS AS NEEDED FOR NAUSEA (Patient not taking: Reported on 08/02/2021), Disp: , Rfl:   No results found.  No images are attached to the encounter.   No flowsheet data found. CBC Latest Ref Rng & Units 07/25/2021  WBC 4.0 - 10.5 K/uL 10.8(H)  Hemoglobin 12.0 - 15.0 g/dL 66.0  Hematocrit 63.0 - 46.0 % 36.6  Platelets 150 - 400 K/uL 343     Assessment and plan: Patient is a 45 year old female with  history of iron deficiency anemia and this is a routine follow-up visit  Per patient's present history she does not have any significant heavy menstrual cycles.She received 2 doses of IV iron back in July 2022 and her hemoglobin is presently 12.2 improved as compared to 10.9 before microcytosis is also resolved.  Ferritin levels are normal at 46 but iron saturation is still low at 10%.  Patient also endorses significant fatigue.  I will therefore proceed with 5 doses of Venofer at this time given that her iron saturation is below 20%.  Patient has also been receiving monthly B12 injections and will receive her next B12 injection with today's iron.  Subsequently she is willing to take B12 injections on her own on a monthly basis  With regards to iron deficiency anemia etiology does not clearly attributable to her menstrual cycles since they are not particularly heavy.  Her last EGD was about 2 years ago at [facility] and colonoscopy was several years ago.  I would therefore recommend a GI work-up to rule out any GI source of blood loss as a cause of her iron deficiency anemia.  This year itself patient has received 3 doses of Venofer in March then 2 doses in July and now getting another session of Venofer indicating ongoing iron deficiency  Follow-up instructions: CBC ferritin iron studies and B12 in 3 in  6 months and I will see her in 6 months  I discussed the assessment and treatment plan with the patient. The patient was provided an opportunity to ask questions and all were answered. The patient agreed with the plan and demonstrated an understanding of the instructions.   The patient was advised to call back or seek an in-person evaluation if the symptoms worsen or if the condition fails to improve as anticipated.    Visit Diagnosis: 1. Iron deficiency anemia secondary to inadequate dietary iron intake   2. B12 deficiency     Dr. Owens Shark, MD, MPH [facility] at [facility] Tel- [phone] 08/02/2021 2:31 PM

## 2021-08-09 ENCOUNTER — Inpatient Hospital Stay: Payer: 59

## 2021-08-09 ENCOUNTER — Other Ambulatory Visit: Payer: Self-pay

## 2021-08-09 VITALS — BP 110/76 | HR 93 | Temp 97.0°F | Resp 18

## 2021-08-09 DIAGNOSIS — D508 Other iron deficiency anemias: Secondary | ICD-10-CM

## 2021-08-09 MED ORDER — SODIUM CHLORIDE 0.9 % IV SOLN: 200.0000 mg | INTRAVENOUS | Status: DC

## 2021-08-09 MED ORDER — SODIUM CHLORIDE 0.9 % IV SOLN
Freq: Once | INTRAVENOUS | Status: AC
  Filled 2021-08-09: qty 250

## 2021-08-09 MED ORDER — IRON SUCROSE 20 MG/ML IV SOLN
200.0000 mg | Freq: Once | INTRAVENOUS | Status: AC
  Administered 2021-08-09: 200 mg via INTRAVENOUS

## 2021-08-09 MED ORDER — CYANOCOBALAMIN 1000 MCG/ML IJ SOLN
1000.0000 ug | Freq: Once | INTRAMUSCULAR | Status: AC
  Administered 2021-08-09: 1000 ug via INTRAMUSCULAR

## 2021-08-16 ENCOUNTER — Inpatient Hospital Stay: Payer: 59 | Attending: Oncology

## 2021-08-16 ENCOUNTER — Other Ambulatory Visit: Payer: Self-pay

## 2021-08-16 ENCOUNTER — Inpatient Hospital Stay: Payer: 59

## 2021-08-16 VITALS — BP 132/90 | HR 91 | Temp 97.0°F | Resp 18

## 2021-08-16 DIAGNOSIS — D509 Iron deficiency anemia, unspecified: Secondary | ICD-10-CM | POA: Diagnosis present

## 2021-08-16 DIAGNOSIS — D508 Other iron deficiency anemias: Secondary | ICD-10-CM

## 2021-08-16 MED ORDER — SODIUM CHLORIDE 0.9 % IV SOLN
Freq: Once | INTRAVENOUS | Status: AC
  Filled 2021-08-16: qty 250

## 2021-08-16 MED ORDER — SODIUM CHLORIDE 0.9 % IV SOLN: 200.0000 mg | INTRAVENOUS | Status: DC

## 2021-08-16 MED ORDER — IRON SUCROSE 20 MG/ML IV SOLN
200.0000 mg | Freq: Once | INTRAVENOUS | Status: AC
  Administered 2021-08-16: 200 mg via INTRAVENOUS
  Filled 2021-08-16: qty 10

## 2021-08-23 ENCOUNTER — Inpatient Hospital Stay: Payer: 59 | Attending: Oncology

## 2021-08-23 ENCOUNTER — Inpatient Hospital Stay: Payer: 59

## 2021-08-23 ENCOUNTER — Other Ambulatory Visit: Payer: Self-pay

## 2021-08-23 VITALS — BP 137/98 | HR 82 | Temp 96.0°F | Resp 18

## 2021-08-23 DIAGNOSIS — E538 Deficiency of other specified B group vitamins: Secondary | ICD-10-CM | POA: Insufficient documentation

## 2021-08-23 DIAGNOSIS — D508 Other iron deficiency anemias: Secondary | ICD-10-CM | POA: Insufficient documentation

## 2021-08-23 MED ORDER — SODIUM CHLORIDE 0.9 % IV SOLN
Freq: Once | INTRAVENOUS | Status: AC
  Filled 2021-08-23: qty 250

## 2021-08-23 MED ORDER — IRON SUCROSE 20 MG/ML IV SOLN
200.0000 mg | Freq: Once | INTRAVENOUS | Status: AC
  Administered 2021-08-23: 200 mg via INTRAVENOUS

## 2021-08-23 MED ORDER — SODIUM CHLORIDE 0.9 % IV SOLN: 200.0000 mg | INTRAVENOUS | Status: DC

## 2021-08-30 ENCOUNTER — Inpatient Hospital Stay: Payer: 59

## 2021-08-30 VITALS — BP 126/87 | HR 108 | Temp 97.0°F | Resp 20

## 2021-08-30 DIAGNOSIS — D508 Other iron deficiency anemias: Secondary | ICD-10-CM

## 2021-08-30 MED ORDER — SODIUM CHLORIDE 0.9 % IV SOLN
Freq: Once | INTRAVENOUS | Status: AC
Start: 2021-08-30 — End: 2021-08-30
  Filled 2021-08-30: qty 250

## 2021-08-30 MED ORDER — IRON SUCROSE 20 MG/ML IV SOLN
200.0000 mg | Freq: Once | INTRAVENOUS | Status: AC
  Administered 2021-08-30: 200 mg via INTRAVENOUS

## 2021-08-30 MED ORDER — SODIUM CHLORIDE 0.9 % IV SOLN: 200.0000 mg | INTRAVENOUS | Status: DC

## 2021-10-04 IMAGING — MG MM DIGITAL SCREENING BILAT W/ TOMO AND CAD
8 series · 8 of 24 positions shown · non-contrast
Comparison: None.

CLINICAL DATA: Screening.

EXAM:
DIGITAL SCREENING BILATERAL MAMMOGRAM WITH TOMOSYNTHESIS AND CAD
TECHNIQUE: Bilateral screening digital craniocaudal and mediolateral oblique
mammograms were obtained. Bilateral screening digital breast
tomosynthesis was performed. The images were evaluated with
computer-aided detection.

[R MLO synth-2D]
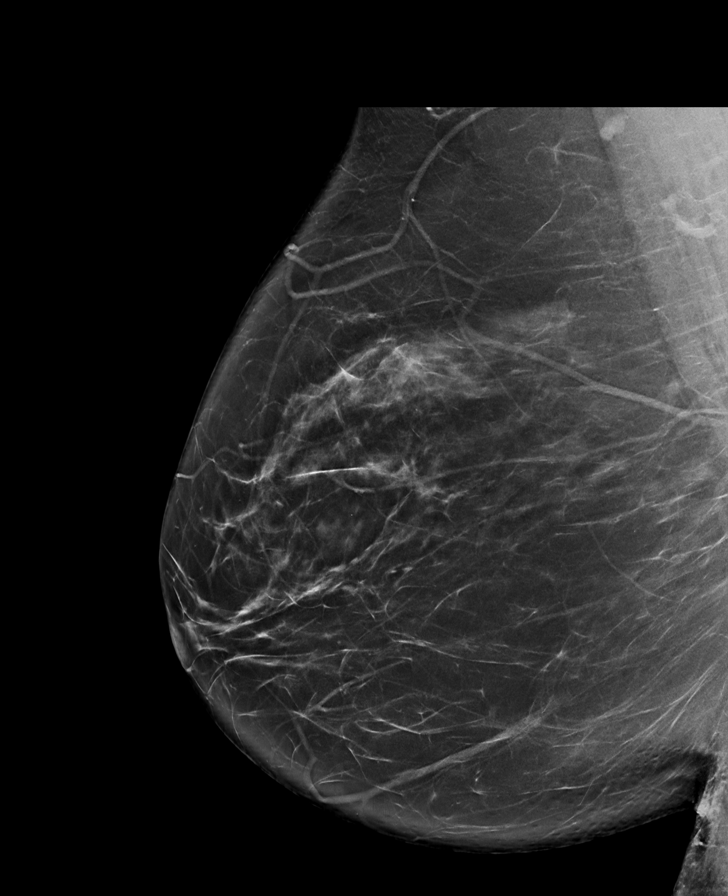

[L CC synth-2D]
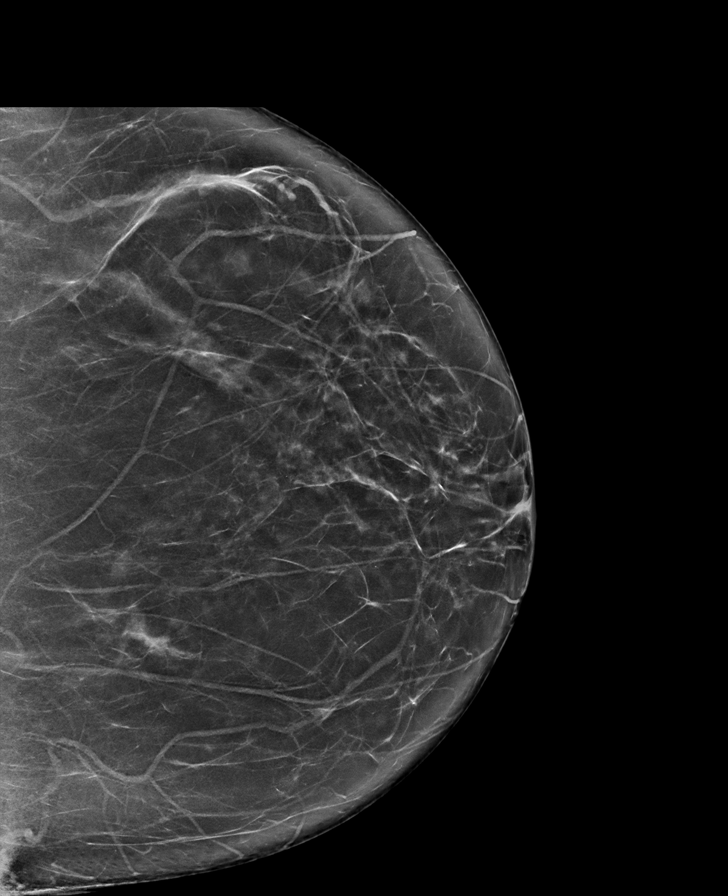

[L MLO synth-2D]
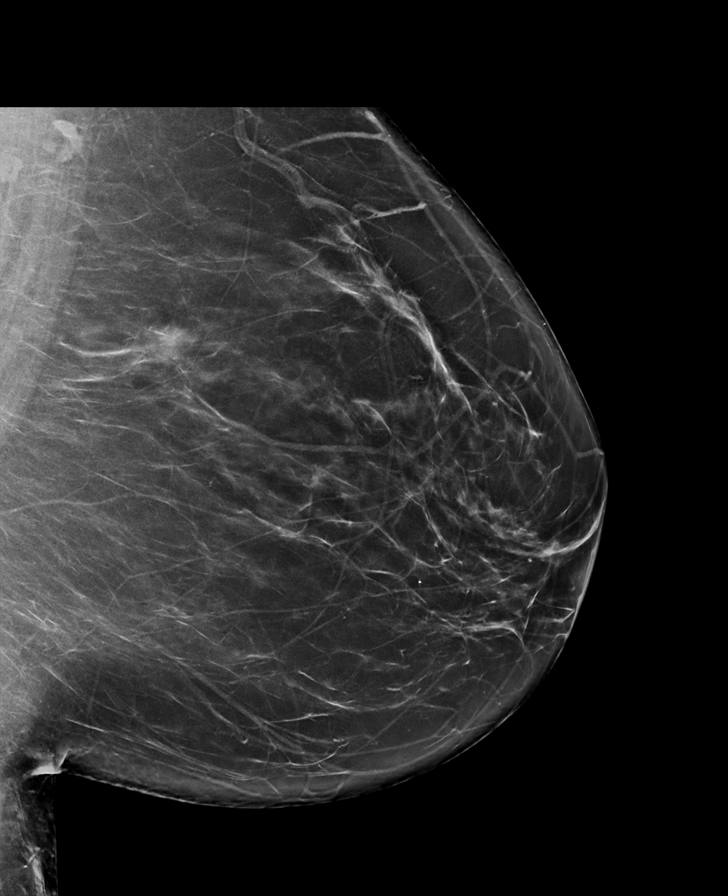

[R CC synth-2D]
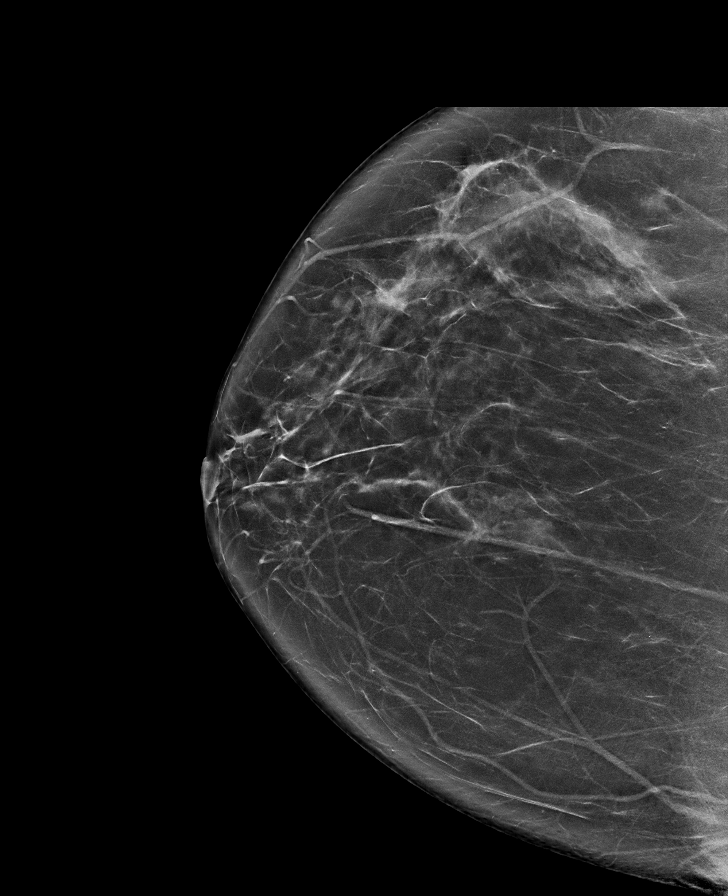

[L CC tomo · tomo slice 41/81.0]
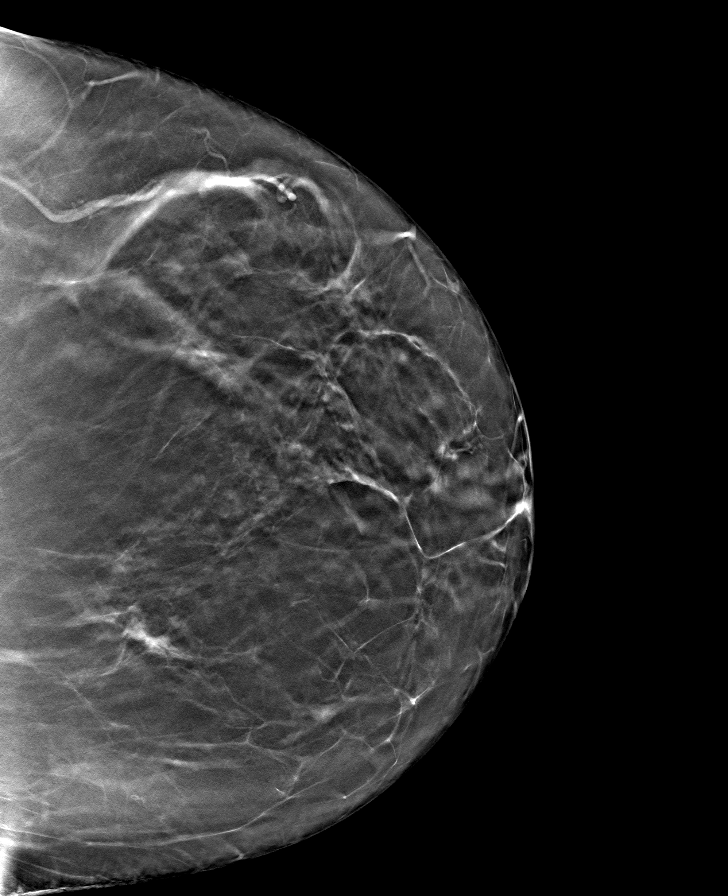

[R CC tomo · tomo slice 43/85.0]
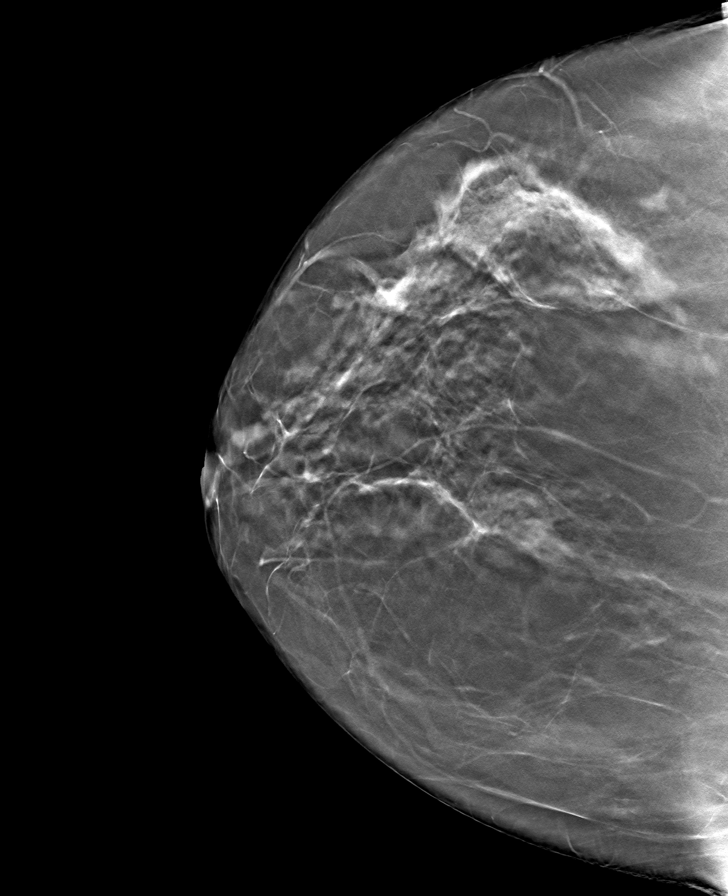

[L MLO tomo · tomo slice 41/82.0]
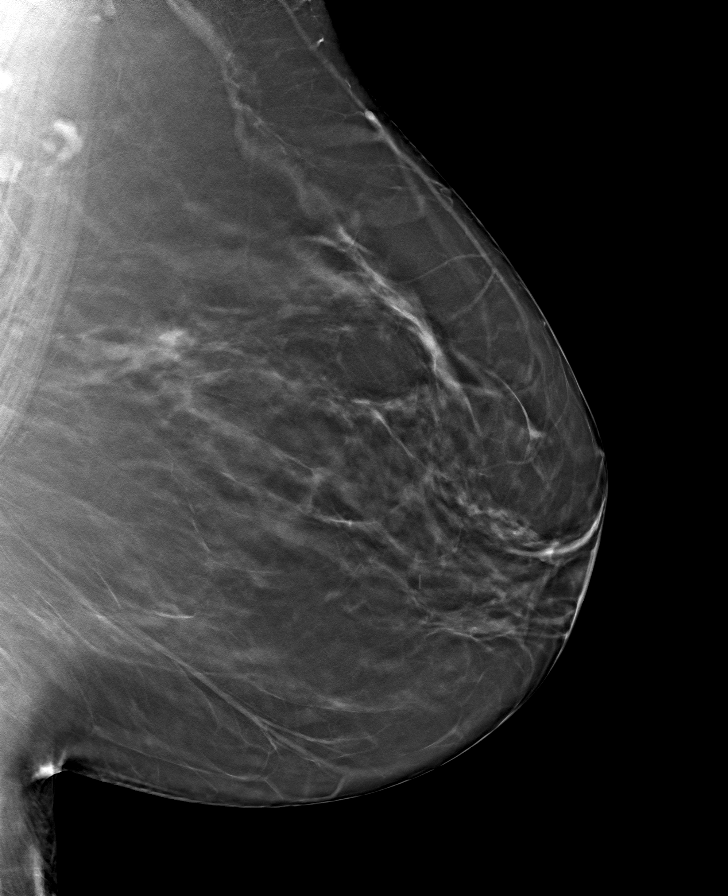

[R MLO tomo · tomo slice 45/88.0]
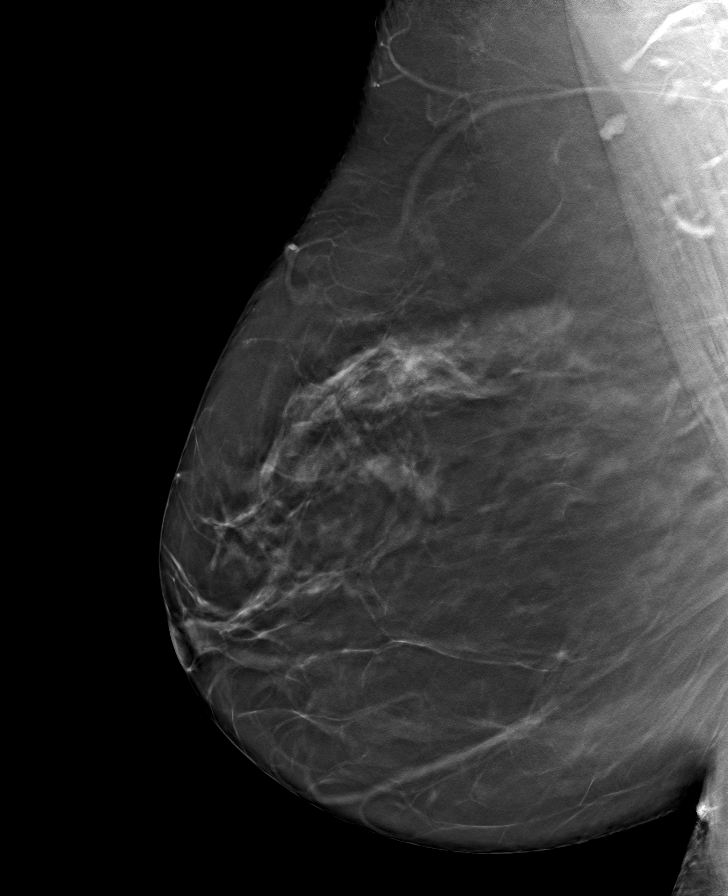

[8 of 24 positions shown; findings below may reference images not displayed]

ACR Breast Density Category b: There are scattered areas of
fibroglandular density.
FINDINGS: In the left breast, a possible asymmetry warrants further
evaluation. In the right breast, no findings suspicious for
malignancy.
IMPRESSION: Further evaluation is suggested for possible asymmetry in the left
breast.

RECOMMENDATION:
Diagnostic mammogram and possibly ultrasound of the left breast.
(Code:[id number])

The patient will be contacted regarding the findings, and additional
imaging will be scheduled.

BI-RADS CATEGORY  0: Incomplete. Need additional imaging evaluation
and/or prior mammograms for comparison.

## 2021-10-10 ENCOUNTER — Other Ambulatory Visit: Payer: Self-pay

## 2021-10-10 ENCOUNTER — Ambulatory Visit
Admission: EM | Admit: 2021-10-10 | Discharge: 2021-10-10 | Disposition: A | Discharge status: Home / Self Care | Payer: 59

## 2021-10-10 ENCOUNTER — Ambulatory Visit (INDEPENDENT_AMBULATORY_CARE_PROVIDER_SITE_OTHER): Payer: 59

## 2021-10-10 DIAGNOSIS — S93402A Sprain of unspecified ligament of left ankle, initial encounter: Secondary | ICD-10-CM

## 2021-10-10 DIAGNOSIS — M25572 Pain in left ankle and joints of left foot: Secondary | ICD-10-CM

## 2021-10-10 NOTE — ED Provider Notes (Addendum)
[facility]    CSN: [id number] Arrival date & time: 10/10/21  1006      History   Chief Complaint Chief Complaint  Patient presents with   Ankle Pain    L    HPI Gloria Mcpherson is a 45 y.o. female who presents with L ankle pain since she slipped on paper and twisted her ankle. She was unable to bare weight this am. Has swelling. Has been using crutches to walk.     Past Medical History:  Diagnosis Date   Anemia    Anxiety    Hypertension    Lupus (HCC)    Lupus (HCC) 2009   Osteoporosis     Patient Active Problem List   Diagnosis Date Noted   B12 deficiency 12/20/2020   Iron deficiency anemia 12/13/2020   Anemia 12/12/2020   Raynaud's disease without gangrene 02/27/2017   Radiculopathy of lumbar region 02/27/2017   UTI (urinary tract infection) 12/28/2015   Uterine fibroids affecting pregnancy 01/05/2014   Pregnancy resulting from assisted reproductive technology 01/05/2014   Systemic lupus erythematosus, unspecified (HCC) 08/01/2011   Sjogren syndrome, unspecified (HCC) 08/01/2011    Past Surgical History:  Procedure Laterality Date   BACK SURGERY     2019 had a laminectomy   CESAREAN SECTION  07/2014   CHOLECYSTECTOMY     CHOLECYSTECTOMY  05/2020    OB History   No obstetric history on file.      Home Medications    Prior to Admission medications   Medication Sig Start Date End Date Taking? Authorizing Provider  acetaminophen (TYLENOL) 500 MG tablet Take 500 mg by mouth every 6 (six) hours as needed.   Yes [provider]  alendronate (FOSAMAX) 70 MG tablet Take 70 mg by mouth.   Yes [provider]  amitriptyline (ELAVIL) 100 MG tablet Take 100 mg by mouth at bedtime. 05/18/21  Yes [provider]  amLODipine-valsartan (EXFORGE) 5-320 MG tablet Take 1 tablet by mouth daily. 05/09/21  Yes [provider]  Digestive Enzymes TABS Take by mouth daily.   Yes [provider]   dronabinol (MARINOL) 5 MG capsule Take by mouth. Patient takes twice a day   Yes [provider]  escitalopram (LEXAPRO) 10 MG tablet Take 15 mg by mouth daily. 1 and 1/2 tabs daily   Yes [provider]  famotidine (PEPCID) 40 MG tablet Take by mouth. 09/28/20  Yes [provider]  hydrochlorothiazide (MICROZIDE) 12.5 MG capsule Take 12.5 mg by mouth daily.   Yes [provider]  hydroxychloroquine (PLAQUENIL) 200 MG tablet Take 200 mg by mouth daily. 12/18/13  Yes [provider]  LORazepam (ATIVAN) 1 MG tablet Take 1 mg by mouth as needed. 01/26/16  Yes [provider]  naproxen sodium (ALEVE) 220 MG tablet Take by mouth.   Yes [provider]  predniSONE (DELTASONE) 10 MG tablet Take 5 mg by mouth at bedtime as needed (take 5-20mg , as needed).   Yes [provider]  prochlorperazine (COMPAZINE) 5 MG tablet  09/12/16  Yes [provider]  RABEprazole (ACIPHEX) 20 MG tablet Take 20 mg by mouth daily. 01/04/16  Yes [provider]  Syringe/Needle, Disp, (SYRINGE 3CC/25GX1") 25G X 1" 3 ML MISC 1 mL by Does not apply route every 30 (thirty) days. 08/02/21  Yes Creig Hines, MD  amitriptyline (ELAVIL) 75 MG tablet Take 75 mg by mouth. 12/04/13   [provider]  amLODIPine-Valsartan-HCTZ 10-160-12.5 MG TABS  [provider]  dicyclomine (BENTYL) 10 MG capsule Take 10 mg by mouth every 6 (six) hours as needed. 11/23/20   [provider]  HYDROcodone-acetaminophen (NORCO/VICODIN) 5-325 MG tablet Take 1 tablet by mouth every 6 (six) hours as needed. Patient not taking: Reported on 08/02/2021 05/30/21   [provider]  NON FORMULARY Take by mouth 3 (three) times daily with meals.  Patient not taking: No sig reported    [provider]    Family History Family History  Problem Relation Age of Onset   Hypertension Mother    Hypertension Paternal Uncle    Diabetes  Maternal Grandmother     Social History Social History   Tobacco Use   Smoking status: Never   Smokeless tobacco: Never  Substance Use Topics   Alcohol use: Yes    Alcohol/week: 1.0 standard drink    Types: 1 Glasses of wine per week   Drug use: Never     Allergies   Cat hair extract   Review of Systems Review of Systems  Musculoskeletal:  Positive for arthralgias and joint swelling.  Skin:  Negative for color change, rash and wound.  Neurological:  Negative for weakness and numbness.    Physical Exam Triage Vital Signs ED Triage Vitals  Enc Vitals Group     BP 10/10/21 1154 (!) 121/96     Pulse Rate 10/10/21 1154 (!) 107     Resp 10/10/21 1154 18     Temp 10/10/21 1154 98.3 F (36.8 C)     Temp Source 10/10/21 1154 Oral     SpO2 10/10/21 1154 100 %     Weight --      Height --      Head Circumference --      Peak Flow --      Pain Score 10/10/21 1153 5     Pain Loc --      Pain Edu? --      Excl. in [place]? --    No data found.  Updated Vital Signs BP (!) 121/96 (BP Location: Left Arm)    Pulse (!) 107    Temp 98.3 F (36.8 C) (Oral)    Resp 18    SpO2 100%   Visual Acuity Right Eye Distance:   Left Eye Distance:   Bilateral Distance:    Right Eye Near:   Left Eye Near:    Bilateral Near:     Physical Exam Vitals and nursing note reviewed.  Constitutional:      Appearance: She is obese.  Eyes:     General: No scleral icterus. Cardiovascular:     Pulses: Normal pulses.  Pulmonary:     Effort: Pulmonary effort is normal.  Musculoskeletal:     Right lower leg: No edema.     Left lower leg: No edema.     Comments: L ANKLE- with swelling of lateral malleolus which is tender to palpation. ROM is limited due to pain.   Skin:    General: Skin is warm and dry.     Findings: No bruising or erythema.  Neurological:     Mental Status: She is alert and oriented to person, place, and time.     Sensory: No sensory deficit.     Gait: Gait abnormal.   Psychiatric:        Mood and Affect: Mood normal.        Behavior: Behavior normal.        Thought Content: Thought content  normal.        Judgment: Judgment normal.     UC Treatments / Results  Labs (all labs ordered are listed, but only abnormal results are displayed) Labs Reviewed - No data to display  EKG   Radiology DG Ankle Complete Left  Result Date: 10/10/2021 CLINICAL DATA:  Ankle injury EXAM: LEFT ANKLE COMPLETE - 3+ VIEW COMPARISON:  None. FINDINGS: There is no evidence of fracture, dislocation, or joint effusion. There is no evidence of arthropathy or other focal bone abnormality. Soft tissues are unremarkable. IMPRESSION: Negative. Electronically Signed   By: Yetta Glassman M.D.   On: 10/10/2021 12:21    Procedures Procedures (including critical care time)  Medications Ordered in UC Medications - No data to display  Initial Impression / Assessment and Plan / UC Course  I have reviewed the triage vital signs and the nursing notes. Pertinent  imaging results that were available during my care of the patient were reviewed by me and considered in my medical decision making (see chart for details). L ankle sprain She was placed on a Lace up ankle brace, may continue crutches and bare weight as tolerated. May take OTC NSAID prn pain. Needs to Fu with ortho. See instructions.   Final Clinical Impressions(s) / UC Diagnoses   Final diagnoses:  Sprain of left ankle, unspecified ligament, initial encounter     Discharge Instructions      Apply a compressive ACE bandage. Rest and elevate the affected painful area.  Apply cold compresses intermittently as needed.  As pain recedes, begin normal activities slowly as tolerated.  Follow up with orthopedics in 3-7 days     ED Prescriptions   None    PDMP not reviewed this encounter.   Shelby Mattocks, PA-C 10/10/21 1238    Rodriguez-[place], [place], PA-C 10/10/21 1239

## 2021-10-10 NOTE — ED Triage Notes (Signed)
Pt stepped on paper that was on floor, slipped and felt L ankle pop last night.  Unable to place any weight on it this morning.  Swelling noted.

## 2021-10-10 NOTE — Discharge Instructions (Addendum)
Apply a compressive ACE bandage. Rest and elevate the affected painful area.  Apply cold compresses intermittently as needed.  As pain recedes, begin normal activities slowly as tolerated.  Follow up with orthopedics in 3-7 days

## 2021-10-25 ENCOUNTER — Other Ambulatory Visit: Payer: Self-pay | Admitting: *Deleted

## 2021-10-25 DIAGNOSIS — D508 Other iron deficiency anemias: Secondary | ICD-10-CM

## 2021-11-03 ENCOUNTER — Other Ambulatory Visit: Payer: Self-pay

## 2021-11-03 ENCOUNTER — Inpatient Hospital Stay: Payer: 59 | Attending: Oncology

## 2021-11-03 DIAGNOSIS — D509 Iron deficiency anemia, unspecified: Secondary | ICD-10-CM | POA: Diagnosis present

## 2021-11-03 DIAGNOSIS — D508 Other iron deficiency anemias: Secondary | ICD-10-CM

## 2021-11-03 LAB — IRON AND TIBC
Iron: 74 ug/dL (ref 28–170)
Saturation Ratios: 24 % (ref 10.4–31.8)
TIBC: 309 ug/dL (ref 250–450)
UIBC: 235 ug/dL

## 2021-11-03 LAB — CBC
HCT: 39.1 % (ref 36.0–46.0)
Hemoglobin: 12.9 g/dL (ref 12.0–15.0)
MCH: 31 pg (ref 26.0–34.0)
MCHC: 33 g/dL (ref 30.0–36.0)
MCV: 94 fL (ref 80.0–100.0)
Platelets: 287 10*3/uL (ref 150–400)
RBC: 4.16 MIL/uL (ref 3.87–5.11)
RDW: 13.7 % (ref 11.5–15.5)
WBC: 9.2 10*3/uL (ref 4.0–10.5)
nRBC: 0 % (ref 0.0–0.2)

## 2021-11-03 LAB — FERRITIN: Ferritin: 401 ng/mL — ABNORMAL HIGH (ref 11–307)

## 2021-11-03 LAB — VITAMIN B12: Vitamin B-12: 455 pg/mL (ref 180–914)

## 2021-12-19 ENCOUNTER — Other Ambulatory Visit: Payer: Self-pay | Admitting: Family Medicine

## 2021-12-19 DIAGNOSIS — Z1231 Encounter for screening mammogram for malignant neoplasm of breast: Secondary | ICD-10-CM

## 2022-01-12 ENCOUNTER — Ambulatory Visit
Admission: RE | Admit: 2022-01-12 | Discharge: 2022-01-12 | Disposition: A | Discharge status: Home / Self Care | Payer: 59 | Source: Ambulatory Visit | Attending: Family Medicine | Admitting: Family Medicine

## 2022-01-12 ENCOUNTER — Ambulatory Visit: Payer: 59

## 2022-01-12 DIAGNOSIS — Z1231 Encounter for screening mammogram for malignant neoplasm of breast: Secondary | ICD-10-CM

## 2022-02-01 ENCOUNTER — Other Ambulatory Visit: Payer: Self-pay

## 2022-02-01 DIAGNOSIS — E538 Deficiency of other specified B group vitamins: Secondary | ICD-10-CM

## 2022-02-01 DIAGNOSIS — D508 Other iron deficiency anemias: Secondary | ICD-10-CM

## 2022-02-02 ENCOUNTER — Inpatient Hospital Stay: Payer: 59 | Attending: Oncology

## 2022-02-02 ENCOUNTER — Encounter: Payer: Self-pay | Admitting: Oncology

## 2022-02-02 ENCOUNTER — Inpatient Hospital Stay: Payer: 59 | Admitting: Oncology

## 2022-02-02 VITALS — BP 121/89 | HR 93 | Temp 98.2°F | Resp 16 | Wt 211.3 lb

## 2022-02-02 DIAGNOSIS — E538 Deficiency of other specified B group vitamins: Secondary | ICD-10-CM

## 2022-02-02 DIAGNOSIS — D509 Iron deficiency anemia, unspecified: Secondary | ICD-10-CM | POA: Diagnosis present

## 2022-02-02 DIAGNOSIS — D508 Other iron deficiency anemias: Secondary | ICD-10-CM

## 2022-02-02 LAB — CBC WITH DIFFERENTIAL/PLATELET
Abs Immature Granulocytes: 0.16 10*3/uL — ABNORMAL HIGH (ref 0.00–0.07)
Basophils Absolute: 0.1 10*3/uL (ref 0.0–0.1)
Basophils Relative: 1 %
Eosinophils Absolute: 0.1 10*3/uL (ref 0.0–0.5)
Eosinophils Relative: 1 %
HCT: 41.6 % (ref 36.0–46.0)
Hemoglobin: 13.5 g/dL (ref 12.0–15.0)
Immature Granulocytes: 1 %
Lymphocytes Relative: 16 %
Lymphs Abs: 2.1 10*3/uL (ref 0.7–4.0)
MCH: 30.3 pg (ref 26.0–34.0)
MCHC: 32.5 g/dL (ref 30.0–36.0)
MCV: 93.3 fL (ref 80.0–100.0)
Monocytes Absolute: 1 10*3/uL (ref 0.1–1.0)
Monocytes Relative: 8 %
Neutro Abs: 9.4 10*3/uL — ABNORMAL HIGH (ref 1.7–7.7)
Neutrophils Relative %: 73 %
Platelets: 299 10*3/uL (ref 150–400)
RBC: 4.46 MIL/uL (ref 3.87–5.11)
RDW: 12.8 % (ref 11.5–15.5)
WBC: 12.8 10*3/uL — ABNORMAL HIGH (ref 4.0–10.5)
nRBC: 0 % (ref 0.0–0.2)

## 2022-02-02 LAB — VITAMIN B12: Vitamin B-12: 356 pg/mL (ref 180–914)

## 2022-02-02 LAB — COMPREHENSIVE METABOLIC PANEL
ALT: 19 U/L (ref 0–44)
AST: 19 U/L (ref 15–41)
Albumin: 3.8 g/dL (ref 3.5–5.0)
Alkaline Phosphatase: 66 U/L (ref 38–126)
Anion gap: 6 (ref 5–15)
BUN: 15 mg/dL (ref 6–20)
CO2: 27 mmol/L (ref 22–32)
Calcium: 8.6 mg/dL — ABNORMAL LOW (ref 8.9–10.3)
Chloride: 100 mmol/L (ref 98–111)
Creatinine, Ser: 0.83 mg/dL (ref 0.44–1.00)
GFR, Estimated: >60 mL/min (ref >60)
Glucose, Bld: 82 mg/dL (ref 70–99)
Potassium: 3.6 mmol/L (ref 3.5–5.1)
Sodium: 133 mmol/L — ABNORMAL LOW (ref 135–145)
Total Bilirubin: 0.2 mg/dL — ABNORMAL LOW (ref 0.3–1.2)
Total Protein: 7 g/dL (ref 6.5–8.1)

## 2022-02-02 LAB — IRON AND TIBC
Iron: 65 ug/dL (ref 28–170)
Saturation Ratios: 22 % (ref 10.4–31.8)
TIBC: 300 ug/dL (ref 250–450)
UIBC: 235 ug/dL

## 2022-02-02 LAB — FERRITIN: Ferritin: 261 ng/mL (ref 11–307)

## 2022-02-02 NOTE — Progress Notes (Signed)
? ? ? ?Hematology/Oncology Consult note ?[facility]  ?[phone] [phone] ? ?Patient Care Team: ?Deatra James, MD as PCP - General (Family Medicine)  ? ?Name of the patient: Gloria Mcpherson  ?[id number]  ?Jan 20, 1976  ? ?Date of visit: 02/02/22 ? ?Diagnosis-iron deficiency anemia ? ?Chief complaint/ Reason for visit-routine follow-up of iron deficiency anemia ? ?Heme/Onc history:  Patient is a 46 year old female with history of iron and B12 deficiency anemia.  She has received Venofer in the past.  Because of iron deficiency has been attributed to heavy menses in the past.  EGD was about 2 years ago and colonoscopy was several years ago.  History of lupus and Sjogren's. ?Patient was seen again by GI at [facility] and underwent EGD and colonoscopy which did not show any evidence of bleeding or malignancy.  Capsule endoscopy was not done at that time.  Iron deficiency has been attributed to poor absorption. ? ?Interval history-patient presently feels well and denies any specific complaints at this time other than mild fatigue. ? ?ECOG PS- 0 ?Pain scale- 0 ? ? ?Review of systems- Review of Systems  ?Constitutional:  Positive for malaise/fatigue.   ? ? ?Allergies  ?Allergen Reactions  ? Cat Hair Extract Itching  ? ? ? ?Past Medical History:  ?Diagnosis Date  ? Anemia   ? Anxiety   ? Hypertension   ? Lupus (HCC)   ? Lupus (HCC) 2009  ? Osteoporosis   ? ? ? ?Past Surgical History:  ?Procedure Laterality Date  ? BACK SURGERY    ? 2019 had a laminectomy  ? CESAREAN SECTION  07/2014  ? CHOLECYSTECTOMY    ? CHOLECYSTECTOMY  05/2020  ? ? ?Social History  ? ?Socioeconomic History  ? Marital status: Married  ?  Spouse name: Not on file  ? Number of children: Not on file  ? Years of education: Not on file  ? Highest education level: Not on file  ?Occupational History  ? Not on file  ?Tobacco Use  ? Smoking status: Never  ? Smokeless tobacco: Never  ?Vaping Use  ?  Vaping Use: Not on file  ?Substance and Sexual Activity  ? Alcohol use: Yes  ?  Alcohol/week: 1.0 standard drink  ?  Types: 1 Glasses of wine per week  ? Drug use: Never  ? Sexual activity: Yes  ?Other Topics Concern  ? Not on file  ?Social History Narrative  ? Not on file  ? ?Social Determinants of Health  ? ?Financial Resource Strain: Not on file  ?Food Insecurity: Not on file  ?Transportation Needs: Not on file  ?Physical Activity: Not on file  ?Stress: Not on file  ?Social Connections: Not on file  ?Intimate Partner Violence: Not on file  ? ? ?Family History  ?Problem Relation Age of Onset  ? Hypertension Mother   ? Hypertension Paternal Uncle   ? Diabetes Maternal Grandmother   ? Breast cancer Neg Hx   ? ? ? ?Current Outpatient Medications:  ?  acetaminophen (TYLENOL) 500 MG tablet, Take 500 mg by mouth every 6 (six) hours as needed., Disp: , Rfl:  ?  alendronate (FOSAMAX) 70 MG tablet, Take 70 mg by mouth., Disp: , Rfl:  ?  amitriptyline (ELAVIL) 100 MG tablet, Take 100 mg by mouth at bedtime., Disp: , Rfl:  ?  amLODipine-valsartan (EXFORGE) 5-320 MG tablet, Take 1 tablet by mouth daily., Disp: , Rfl:  ?  amLODIPine-Valsartan-HCTZ 10-160-12.5 MG TABS, , Disp: , Rfl:  ?  dicyclomine (BENTYL) 10 MG capsule, Take 10 mg by mouth every 6 (six) hours as needed., Disp: , Rfl:  ?  Digestive Enzymes TABS, Take by mouth daily., Disp: , Rfl:  ?  dronabinol (MARINOL) 5 MG capsule, Take by mouth. Patient takes twice a day, Disp: , Rfl:  ?  escitalopram (LEXAPRO) 10 MG tablet, Take 15 mg by mouth daily. 1 and 1/2 tabs daily, Disp: , Rfl:  ?  famotidine (PEPCID) 40 MG tablet, Take by mouth., Disp: , Rfl:  ?  hydrochlorothiazide (MICROZIDE) 12.5 MG capsule, Take 12.5 mg by mouth daily., Disp: , Rfl:  ?  hydroxychloroquine (PLAQUENIL) 200 MG tablet, Take 200 mg by mouth daily., Disp: , Rfl:  ?  LORazepam (ATIVAN) 1 MG tablet, Take 1 mg by mouth as needed., Disp: , Rfl:  ?  naproxen sodium (ALEVE) 220 MG tablet, Take by mouth.,  Disp: , Rfl:  ?  predniSONE (DELTASONE) 10 MG tablet, Take 5 mg by mouth at bedtime as needed (take 5-20mg , as needed)., Disp: , Rfl:  ?  prochlorperazine (COMPAZINE) 5 MG tablet, , Disp: , Rfl:  ?  RABEprazole (ACIPHEX) 20 MG tablet, Take 20 mg by mouth daily., Disp: , Rfl:  ?  Syringe/Needle, Disp, (SYRINGE 3CC/25GX1") 25G X 1" 3 ML MISC, 1 mL by Does not apply route every 30 (thirty) days., Disp: 5 each, Rfl: 0 ?  HYDROcodone-acetaminophen (NORCO/VICODIN) 5-325 MG tablet, Take 1 tablet by mouth every 6 (six) hours as needed. (Patient not taking: Reported on 08/02/2021), Disp: , Rfl:  ?  NON FORMULARY, Take by mouth 3 (three) times daily with meals.  (Patient not taking: Reported on 12/20/2020), Disp: , Rfl:  ? ?Physical exam:  ?Vitals:  ? 02/02/22 1413  ?BP: 121/89  ?Pulse: 93  ?Resp: 16  ?Temp: 98.2 ?F (36.8 ?C)  ?SpO2: 100%  ?Weight: 211 lb 4.8 oz (95.8 kg)  ? ?Physical Exam ?Constitutional:   ?   General: She is not in acute distress. ?Cardiovascular:  ?   Rate and Rhythm: Normal rate and regular rhythm.  ?   Heart sounds: Normal heart sounds.  ?Pulmonary:  ?   Effort: Pulmonary effort is normal.  ?   Breath sounds: Normal breath sounds.  ?Abdominal:  ?   General: Bowel sounds are normal.  ?   Palpations: Abdomen is soft.  ?Skin: ?   General: Skin is warm and dry.  ?Neurological:  ?   Mental Status: She is alert and oriented to person, place, and time.  ?  ? ? ?  Latest Ref Rng & Units 02/02/2022  ?  1:54 PM  ?CMP  ?Glucose 70 - 99 mg/dL 82    ?BUN 6 - 20 mg/dL 15    ?Creatinine 0.44 - 1.00 mg/dL 1.610.83    ?Sodium 135 - 145 mmol/L 133    ?Potassium 3.5 - 5.1 mmol/L 3.6    ?Chloride 98 - 111 mmol/L 100    ?CO2 22 - 32 mmol/L 27    ?Calcium 8.9 - 10.3 mg/dL 8.6    ?Total Protein 6.5 - 8.1 g/dL 7.0    ?Total Bilirubin 0.3 - 1.2 mg/dL 0.2    ?Alkaline Phos 38 - 126 U/L 66    ?AST 15 - 41 U/L 19    ?ALT 0 - 44 U/L 19    ? ? ?  Latest Ref Rng & Units 02/02/2022  ?  1:54 PM  ?CBC  ?WBC 4.0 - 10.5 K/uL 12.8    ?Hemoglobin  12.0 - 15.0 g/dL 82.7    ?Hematocrit 36.0 - 46.0 % 41.6    ?Platelets 150 - 400 K/uL 299    ? ? ?No images are attached to the encounter. ? ?MM 3D SCREEN BREAST BILATERAL ? ?Result Date: 01/15/2022 ?CLINICAL DATA:  Screening. EXAM: DIGITAL SCREENING BILATERAL MAMMOGRAM WITH TOMOSYNTHESIS AND CAD TECHNIQUE: Bilateral screening digital craniocaudal and mediolateral oblique mammograms were obtained. Bilateral screening digital breast tomosynthesis was performed. The images were evaluated with computer-aided detection. COMPARISON:  Previous exam(s). ACR Breast Density Category b: There are scattered areas of fibroglandular density. FINDINGS: There are no findings suspicious for malignancy. IMPRESSION: No mammographic evidence of malignancy. A result letter of this screening mammogram will be mailed directly to the patient. RECOMMENDATION: Screening mammogram in one year. (Code:SM-B-01Y) BI-RADS CATEGORY  1: Negative. Electronically Signed   By: Bary Richard M.D.   On: 01/15/2022 10:50   ? ? ?Assessment and plan- Patient is a 46 y.o. female With iron deficiency anemia here for routine follow-up ? ?Patient is not presently anemic with a hemoglobin of 13.5/41.6 which is improved as compared to a year ago when she was 10.9.  Iron studies and B12 levels are currently pending.  We will call her if she needs IV iron.  Repeat CBC ferritin and iron studies in 3 in 6 months and I will see her back in 6 months ? ? ?  ?Visit Diagnosis ?1. Iron deficiency anemia secondary to inadequate dietary iron intake   ?2. B12 deficiency   ? ? ? ?Dr. Owens Shark, MD, MPH ?[facility] ?[phone] ?02/02/2022 ?3:02 PM ? ? ? ? ? ? ?    ? ? ? ? ? ?

## 2022-05-07 ENCOUNTER — Other Ambulatory Visit: Payer: Self-pay

## 2022-05-07 DIAGNOSIS — D508 Other iron deficiency anemias: Secondary | ICD-10-CM

## 2022-05-07 DIAGNOSIS — E538 Deficiency of other specified B group vitamins: Secondary | ICD-10-CM

## 2022-05-08 ENCOUNTER — Inpatient Hospital Stay: Payer: 59 | Attending: Oncology

## 2022-05-08 DIAGNOSIS — E538 Deficiency of other specified B group vitamins: Secondary | ICD-10-CM | POA: Insufficient documentation

## 2022-05-08 DIAGNOSIS — D508 Other iron deficiency anemias: Secondary | ICD-10-CM

## 2022-05-08 DIAGNOSIS — D509 Iron deficiency anemia, unspecified: Secondary | ICD-10-CM | POA: Diagnosis present

## 2022-05-08 LAB — CBC WITH DIFFERENTIAL/PLATELET
Abs Immature Granulocytes: 0.11 10*3/uL — ABNORMAL HIGH (ref 0.00–0.07)
Basophils Absolute: 0 10*3/uL (ref 0.0–0.1)
Basophils Relative: 0 %
Eosinophils Absolute: 0 10*3/uL (ref 0.0–0.5)
Eosinophils Relative: 0 %
HCT: 41.8 % (ref 36.0–46.0)
Hemoglobin: 13.6 g/dL (ref 12.0–15.0)
Immature Granulocytes: 1 %
Lymphocytes Relative: 17 %
Lymphs Abs: 2.1 10*3/uL (ref 0.7–4.0)
MCH: 30.4 pg (ref 26.0–34.0)
MCHC: 32.5 g/dL (ref 30.0–36.0)
MCV: 93.5 fL (ref 80.0–100.0)
Monocytes Absolute: 1 10*3/uL (ref 0.1–1.0)
Monocytes Relative: 8 %
Neutro Abs: 9.2 10*3/uL — ABNORMAL HIGH (ref 1.7–7.7)
Neutrophils Relative %: 74 %
Platelets: 340 10*3/uL (ref 150–400)
RBC: 4.47 MIL/uL (ref 3.87–5.11)
RDW: 12.6 % (ref 11.5–15.5)
WBC: 12.4 10*3/uL — ABNORMAL HIGH (ref 4.0–10.5)
nRBC: 0 % (ref 0.0–0.2)

## 2022-05-08 LAB — IRON AND TIBC
Iron: 45 ug/dL (ref 28–170)
Saturation Ratios: 15 % (ref 10.4–31.8)
TIBC: 311 ug/dL (ref 250–450)
UIBC: 266 ug/dL

## 2022-05-08 LAB — FERRITIN: Ferritin: 260 ng/mL (ref 11–307)

## 2022-05-08 LAB — VITAMIN B12: Vitamin B-12: 291 pg/mL (ref 180–914)

## 2022-08-07 ENCOUNTER — Inpatient Hospital Stay: Payer: 59 | Attending: Oncology

## 2022-08-07 DIAGNOSIS — E538 Deficiency of other specified B group vitamins: Secondary | ICD-10-CM

## 2022-08-07 DIAGNOSIS — D508 Other iron deficiency anemias: Secondary | ICD-10-CM

## 2022-08-07 DIAGNOSIS — D509 Iron deficiency anemia, unspecified: Secondary | ICD-10-CM | POA: Insufficient documentation

## 2022-08-07 LAB — CBC WITH DIFFERENTIAL/PLATELET
Abs Immature Granulocytes: 0.05 10*3/uL (ref 0.00–0.07)
Basophils Absolute: 0 10*3/uL (ref 0.0–0.1)
Basophils Relative: 0 %
Eosinophils Absolute: 0.1 10*3/uL (ref 0.0–0.5)
Eosinophils Relative: 1 %
HCT: 39.7 % (ref 36.0–46.0)
Hemoglobin: 12.9 g/dL (ref 12.0–15.0)
Immature Granulocytes: 1 %
Lymphocytes Relative: 15 %
Lymphs Abs: 1.6 10*3/uL (ref 0.7–4.0)
MCH: 30 pg (ref 26.0–34.0)
MCHC: 32.5 g/dL (ref 30.0–36.0)
MCV: 92.3 fL (ref 80.0–100.0)
Monocytes Absolute: 1 10*3/uL (ref 0.1–1.0)
Monocytes Relative: 9 %
Neutro Abs: 7.5 10*3/uL (ref 1.7–7.7)
Neutrophils Relative %: 74 %
Platelets: 291 10*3/uL (ref 150–400)
RBC: 4.3 MIL/uL (ref 3.87–5.11)
RDW: 12.5 % (ref 11.5–15.5)
WBC: 10.2 10*3/uL (ref 4.0–10.5)
nRBC: 0 % (ref 0.0–0.2)

## 2022-08-07 LAB — IRON AND TIBC
Iron: 48 ug/dL (ref 28–170)
Saturation Ratios: 17 % (ref 10.4–31.8)
TIBC: 281 ug/dL (ref 250–450)
UIBC: 233 ug/dL

## 2022-08-07 LAB — FERRITIN: Ferritin: 171 ng/mL (ref 11–307)

## 2022-08-08 ENCOUNTER — Telehealth: Payer: 59 | Admitting: Oncology

## 2022-08-13 ENCOUNTER — Inpatient Hospital Stay (HOSPITAL_BASED_OUTPATIENT_CLINIC_OR_DEPARTMENT_OTHER): Payer: 59 | Admitting: Oncology

## 2022-08-13 DIAGNOSIS — D508 Other iron deficiency anemias: Secondary | ICD-10-CM | POA: Diagnosis not present

## 2022-08-13 DIAGNOSIS — E538 Deficiency of other specified B group vitamins: Secondary | ICD-10-CM

## 2022-08-13 NOTE — Progress Notes (Unsigned)
Pt has recently noticed "light headness" more frequently now. States that this morning she experienced a headache behind her eye; but not as painful as a migraine. The rt side of her face was also tingling was seen by an on-site nurse but per nurse pupils and everything else looked fine.

## 2022-08-14 ENCOUNTER — Encounter: Payer: Self-pay | Admitting: Hematology and Oncology

## 2022-08-14 ENCOUNTER — Encounter: Payer: Self-pay | Admitting: Oncology

## 2022-08-14 NOTE — Progress Notes (Signed)
I connected with Gloria Mcpherson on 08/14/22 at  3:00 PM EDT by video enabled telemedicine visit and verified that I am speaking with the correct person using two identifiers.   I discussed the limitations, risks, security and privacy concerns of performing an evaluation and management service by telemedicine and the availability of in-person appointments. I also discussed with the patient that there may be a patient responsible charge related to this service. The patient expressed understanding and agreed to proceed.  Other persons participating in the visit and their role in the encounter:  none  Patient's location:  home Provider's location:  work  Stage manager Complaint:  routine f/u of iron deficiency anemia  History of present illness: Patient is a 46 year old female with history of iron and B12 deficiency anemia.  She has received Venofer in the past.  Because of iron deficiency has been attributed to heavy menses in the past.  EGD was about 2 years ago and colonoscopy was several years ago.  History of lupus and Sjogren's. Patient was seen again by GI at [facility] and underwent EGD and colonoscopy which did not show any evidence of bleeding or malignancy.  Capsule endoscopy was not done at that time.  Iron deficiency has been attributed to poor absorption.  Interval history patient reports fatigue and occasional lightheadedness   Review of Systems  Constitutional:  Positive for malaise/fatigue. Negative for chills, fever and weight loss.  HENT:  Negative for congestion, ear discharge and nosebleeds.   Eyes:  Negative for blurred vision.  Respiratory:  Negative for cough, hemoptysis, sputum production, shortness of breath and wheezing.   Cardiovascular:  Negative for chest pain, palpitations, orthopnea and claudication.  Gastrointestinal:  Negative for abdominal pain, blood in stool, constipation, diarrhea, heartburn, melena, nausea and vomiting.  Genitourinary:   Negative for dysuria, flank pain, frequency, hematuria and urgency.  Musculoskeletal:  Negative for back pain, joint pain and myalgias.  Skin:  Negative for rash.  Neurological:  Negative for dizziness, tingling, focal weakness, seizures, weakness and headaches.  Endo/Heme/Allergies:  Does not bruise/bleed easily.  Psychiatric/Behavioral:  Negative for depression and suicidal ideas. The patient does not have insomnia.     Allergies  Allergen Reactions   Cat Hair Extract Itching    Past Medical History:  Diagnosis Date   Anemia    Anxiety    Hypertension    Lupus (HCC)    Lupus (HCC) 2009   Osteoporosis     Past Surgical History:  Procedure Laterality Date   BACK SURGERY     2019 had a laminectomy   CESAREAN SECTION  07/2014   CHOLECYSTECTOMY     CHOLECYSTECTOMY  05/2020    Social History   Socioeconomic History   Marital status: Married    Spouse name: Not on file   Number of children: Not on file   Years of education: Not on file   Highest education level: Not on file  Occupational History   Not on file  Tobacco Use   Smoking status: Never   Smokeless tobacco: Never  Vaping Use   Vaping Use: Not on file  Substance and Sexual Activity   Alcohol use: Yes    Alcohol/week: 1.0 standard drink of alcohol    Types: 1 Glasses of wine per week   Drug use: Never   Sexual activity: Yes  Other Topics Concern   Not on file  Social History Narrative   Not on file   Social Determinants of Health  Financial Resource Strain: Not on file  Food Insecurity: Not on file  Transportation Needs: Not on file  Physical Activity: Not on file  Stress: Not on file  Social Connections: Not on file  Intimate Partner Violence: Not on file    Family History  Problem Relation Age of Onset   Hypertension Mother    Hypertension Paternal Uncle    Diabetes Maternal Grandmother    Breast cancer Neg Hx      Current Outpatient Medications:    acetaminophen (TYLENOL) 500 MG  tablet, Take 500 mg by mouth every 6 (six) hours as needed., Disp: , Rfl:    alendronate (FOSAMAX) 70 MG tablet, Take 70 mg by mouth., Disp: , Rfl:    amitriptyline (ELAVIL) 100 MG tablet, Take 100 mg by mouth at bedtime., Disp: , Rfl:    amLODipine-valsartan (EXFORGE) 5-320 MG tablet, Take 1 tablet by mouth daily., Disp: , Rfl:    amLODIPine-Valsartan-HCTZ 10-160-12.5 MG TABS, , Disp: , Rfl:    dicyclomine (BENTYL) 10 MG capsule, Take 10 mg by mouth every 6 (six) hours as needed., Disp: , Rfl:    Digestive Enzymes TABS, Take by mouth daily., Disp: , Rfl:    dronabinol (MARINOL) 5 MG capsule, Take by mouth. Patient takes twice a day, Disp: , Rfl:    escitalopram (LEXAPRO) 10 MG tablet, Take 15 mg by mouth daily. 1 and 1/2 tabs daily, Disp: , Rfl:    famotidine (PEPCID) 40 MG tablet, Take by mouth., Disp: , Rfl:    hydrochlorothiazide (MICROZIDE) 12.5 MG capsule, Take 12.5 mg by mouth daily., Disp: , Rfl:    HYDROcodone-acetaminophen (NORCO/VICODIN) 5-325 MG tablet, Take 1 tablet by mouth every 6 (six) hours as needed. (Patient not taking: Reported on 08/02/2021), Disp: , Rfl:    hydroxychloroquine (PLAQUENIL) 200 MG tablet, Take 200 mg by mouth daily., Disp: , Rfl:    LORazepam (ATIVAN) 1 MG tablet, Take 1 mg by mouth as needed., Disp: , Rfl:    naproxen sodium (ALEVE) 220 MG tablet, Take by mouth., Disp: , Rfl:    NON FORMULARY, Take by mouth 3 (three) times daily with meals.  (Patient not taking: Reported on 12/20/2020), Disp: , Rfl:    predniSONE (DELTASONE) 10 MG tablet, Take 5 mg by mouth at bedtime as needed (take 5-20mg , as needed)., Disp: , Rfl:    prochlorperazine (COMPAZINE) 5 MG tablet, , Disp: , Rfl:    RABEprazole (ACIPHEX) 20 MG tablet, Take 20 mg by mouth daily., Disp: , Rfl:    Syringe/Needle, Disp, (SYRINGE 3CC/25GX1") 25G X 1" 3 ML MISC, 1 mL by Does not apply route every 30 (thirty) days., Disp: 5 each, Rfl: 0  No results found.  No images are attached to the  encounter.      Latest Ref Rng & Units 02/02/2022    1:54 PM  CMP  Glucose 70 - 99 mg/dL 82   BUN 6 - 20 mg/dL 15   Creatinine 0.44 - 1.00 mg/dL 0.83   Sodium 135 - 145 mmol/L 133   Potassium 3.5 - 5.1 mmol/L 3.6   Chloride 98 - 111 mmol/L 100   CO2 22 - 32 mmol/L 27   Calcium 8.9 - 10.3 mg/dL 8.6   Total Protein 6.5 - 8.1 g/dL 7.0   Total Bilirubin 0.3 - 1.2 mg/dL 0.2   Alkaline Phos 38 - 126 U/L 66   AST 15 - 41 U/L 19   ALT 0 - 44 U/L 19  Latest Ref Rng & Units 08/07/2022   11:14 AM  CBC  WBC 4.0 - 10.5 K/uL 10.2   Hemoglobin 12.0 - 15.0 g/dL 59.7   Hematocrit 41.6 - 46.0 % 39.7   Platelets 150 - 400 K/uL 291      Observation/objective: Appears in no acute distress over video visit today.  Breathing is nonlabored  Assessment and plan: Patient is a 46 year old female visit routine follow-up visit for iron deficiency anemia  Patient is not presently anemic with a hemoglobin of 12.9.  Ferritin levels are normal at 171 with normal iron studies.  Patient does not require any IV iron at this time.  Given the stability of her hemoglobin and iron levels I will monitor it every 4 months and see her back in 8 months  Follow-up instructions: As above  I discussed the assessment and treatment plan with the patient. The patient was provided an opportunity to ask questions and all were answered. The patient agreed with the plan and demonstrated an understanding of the instructions.   The patient was advised to call back or seek an in-person evaluation if the symptoms worsen or if the condition fails to improve as anticipated.  I provided 12 minutes of face-to-face video visit time during this encounter.  Time spent in reviewing labs and coordinating care plan with the team Visit Diagnosis: 1. Iron deficiency anemia secondary to inadequate dietary iron intake   2. B12 deficiency     Dr. Owens Shark, MD, MPH [facility] Tel-  [phone] 08/14/2022 5:28 PM

## 2022-09-13 ENCOUNTER — Ambulatory Visit
Admission: RE | Admit: 2022-09-13 | Discharge: 2022-09-13 | Disposition: A | Discharge status: Home / Self Care | Payer: 59 | Source: Ambulatory Visit | Attending: Physician Assistant | Admitting: Physician Assistant

## 2022-09-13 ENCOUNTER — Other Ambulatory Visit: Payer: Self-pay | Admitting: Physician Assistant

## 2022-09-13 ENCOUNTER — Encounter: Payer: Self-pay | Admitting: Physician Assistant

## 2022-09-13 DIAGNOSIS — R1013 Epigastric pain: Secondary | ICD-10-CM

## 2022-09-13 DIAGNOSIS — R109 Unspecified abdominal pain: Secondary | ICD-10-CM

## 2022-09-13 MED ORDER — IOPAMIDOL (ISOVUE-300) INJECTION 61%
100.0000 mL | Freq: Once | INTRAVENOUS | Status: AC | PRN
  Administered 2022-09-13: 100 mL via INTRAVENOUS

## 2022-11-29 ENCOUNTER — Other Ambulatory Visit: Payer: Self-pay | Admitting: Family Medicine

## 2022-11-29 DIAGNOSIS — Z1231 Encounter for screening mammogram for malignant neoplasm of breast: Secondary | ICD-10-CM

## 2023-01-14 ENCOUNTER — Ambulatory Visit
Admission: RE | Admit: 2023-01-14 | Discharge: 2023-01-14 | Disposition: A | Discharge status: Home / Self Care | Payer: 59 | Source: Ambulatory Visit | Attending: Family Medicine | Admitting: Family Medicine

## 2023-01-14 DIAGNOSIS — Z1231 Encounter for screening mammogram for malignant neoplasm of breast: Secondary | ICD-10-CM

## 2023-04-15 ENCOUNTER — Ambulatory Visit: Payer: 59 | Admitting: Oncology

## 2023-05-01 ENCOUNTER — Inpatient Hospital Stay: Payer: 59 | Attending: Oncology | Admitting: Oncology

## 2023-05-01 DIAGNOSIS — D509 Iron deficiency anemia, unspecified: Secondary | ICD-10-CM | POA: Diagnosis not present

## 2023-05-01 DIAGNOSIS — D508 Other iron deficiency anemias: Secondary | ICD-10-CM

## 2023-05-04 ENCOUNTER — Encounter: Payer: Self-pay | Admitting: Hematology and Oncology

## 2023-05-04 ENCOUNTER — Encounter: Payer: Self-pay | Admitting: Oncology

## 2023-05-04 NOTE — Progress Notes (Signed)
I connected with Gloria Mcpherson on 05/04/23 at 11:30 AM EDT by video enabled telemedicine visit and verified that I am speaking with the correct person using two identifiers.   I discussed the limitations, risks, security and privacy concerns of performing an evaluation and management service by telemedicine and the availability of in-person appointments. I also discussed with the patient that there may be a patient responsible charge related to this service. The patient expressed understanding and agreed to proceed.  Other persons participating in the visit and their role in the encounter:  none  Patient's location:  home Provider's location:  work  Stage manager Complaint: Routine follow-up of iron deficiency anemia  History of present illness:  Patient is a 47 year old female with history of iron and B12 deficiency anemia.  She has received Venofer in the past.  Because of iron deficiency has been attributed to heavy menses in the past.  EGD was about 2 years ago and colonoscopy was several years ago.  History of lupus and Sjogren's. Patient was seen again by GI at [facility] and underwent EGD and colonoscopy which did not show any evidence of bleeding or malignancy.  Capsule endoscopy was not done at that time.  Iron deficiency has been attributed to poor absorption.    Interval history patient is doing well presently and denies any specific complaints at this time.  Denies any bleeding in stool or urine.  Denies any dark melanotic stools   Review of Systems  Constitutional:  Negative for chills, fever, malaise/fatigue and weight loss.  HENT:  Negative for congestion, ear discharge and nosebleeds.   Eyes:  Negative for blurred vision.  Respiratory:  Negative for cough, hemoptysis, sputum production, shortness of breath and wheezing.   Cardiovascular:  Negative for chest pain, palpitations, orthopnea and claudication.  Gastrointestinal:  Negative for abdominal pain,  blood in stool, constipation, diarrhea, heartburn, melena, nausea and vomiting.  Genitourinary:  Negative for dysuria, flank pain, frequency, hematuria and urgency.  Musculoskeletal:  Negative for back pain, joint pain and myalgias.  Skin:  Negative for rash.  Neurological:  Negative for dizziness, tingling, focal weakness, seizures, weakness and headaches.  Endo/Heme/Allergies:  Does not bruise/bleed easily.  Psychiatric/Behavioral:  Negative for depression and suicidal ideas. The patient does not have insomnia.     Allergies  Allergen Reactions   Cat Hair Extract Itching    Past Medical History:  Diagnosis Date   Anemia    Anxiety    Hypertension    Lupus (HCC)    Lupus (HCC) 2009   Osteoporosis     Past Surgical History:  Procedure Laterality Date   BACK SURGERY     2019 had a laminectomy   CESAREAN SECTION  07/2014   CHOLECYSTECTOMY     CHOLECYSTECTOMY  05/2020    Social History   Socioeconomic History   Marital status: Married    Spouse name: Not on file   Number of children: Not on file   Years of education: Not on file   Highest education level: Not on file  Occupational History   Not on file  Tobacco Use   Smoking status: Never   Smokeless tobacco: Never  Vaping Use   Vaping status: Not on file  Substance and Sexual Activity   Alcohol use: Yes    Alcohol/week: 1.0 standard drink of alcohol    Types: 1 Glasses of wine per week   Drug use: Never   Sexual activity: Yes  Other Topics Concern  Not on file  Social History Narrative   Not on file   Social Determinants of Health   Financial Resource Strain: Not on file  Food Insecurity: Low Risk  (04/23/2023)   Received from [facility]   Food vital sign    Within the past 12 months, you worried that your food would run out before you got money to buy more: Never true    Within the past 12 months, the food you bought just didn't last and you didn't have money to get more. : Never true   Transportation Needs: Not on file (04/23/2023)  Physical Activity: Not on file  Stress: Not on file  Social Connections: Not on file  Intimate Partner Violence: Not on file    Family History  Problem Relation Age of Onset   Hypertension Mother    Hypertension Paternal Uncle    Diabetes Maternal Grandmother    Breast cancer Neg Hx      Current Outpatient Medications:    acetaminophen (TYLENOL) 500 MG tablet, Take 500 mg by mouth every 6 (six) hours as needed., Disp: , Rfl:    alendronate (FOSAMAX) 70 MG tablet, Take 70 mg by mouth., Disp: , Rfl:    amitriptyline (ELAVIL) 100 MG tablet, Take 100 mg by mouth at bedtime., Disp: , Rfl:    amLODipine-valsartan (EXFORGE) 5-320 MG tablet, Take 1 tablet by mouth daily., Disp: , Rfl:    amLODIPine-Valsartan-HCTZ 10-160-12.5 MG TABS, , Disp: , Rfl:    dicyclomine (BENTYL) 10 MG capsule, Take 10 mg by mouth every 6 (six) hours as needed., Disp: , Rfl:    Digestive Enzymes TABS, Take by mouth daily., Disp: , Rfl:    dronabinol (MARINOL) 5 MG capsule, Take by mouth. Patient takes twice a day, Disp: , Rfl:    escitalopram (LEXAPRO) 10 MG tablet, Take 15 mg by mouth daily. 1 and 1/2 tabs daily, Disp: , Rfl:    famotidine (PEPCID) 40 MG tablet, Take by mouth., Disp: , Rfl:    hydrochlorothiazide (MICROZIDE) 12.5 MG capsule, Take 12.5 mg by mouth daily., Disp: , Rfl:    HYDROcodone-acetaminophen (NORCO/VICODIN) 5-325 MG tablet, Take 1 tablet by mouth every 6 (six) hours as needed., Disp: , Rfl:    hydroxychloroquine (PLAQUENIL) 200 MG tablet, Take 200 mg by mouth daily., Disp: , Rfl:    LORazepam (ATIVAN) 1 MG tablet, Take 1 mg by mouth as needed., Disp: , Rfl:    naproxen sodium (ALEVE) 220 MG tablet, Take by mouth., Disp: , Rfl:    NON FORMULARY, Take by mouth 3 (three) times daily with meals., Disp: , Rfl:    predniSONE (DELTASONE) 10 MG tablet, Take 5 mg by mouth at bedtime as needed (take 5-20mg , as needed)., Disp: , Rfl:    prochlorperazine  (COMPAZINE) 5 MG tablet, , Disp: , Rfl:    RABEprazole (ACIPHEX) 20 MG tablet, Take 20 mg by mouth daily., Disp: , Rfl:    Syringe/Needle, Disp, (SYRINGE 3CC/25GX1") 25G X 1" 3 ML MISC, 1 mL by Does not apply route every 30 (thirty) days., Disp: 5 each, Rfl: 0  No results found.  No images are attached to the encounter.      Latest Ref Rng & Units 02/02/2022    1:54 PM  CMP  Glucose 70 - 99 mg/dL 82   BUN 6 - 20 mg/dL 15   Creatinine 0.10 - 1.00 mg/dL 2.72   Sodium 536 - 644 mmol/L 133   Potassium 3.5 - 5.1 mmol/L  3.6   Chloride 98 - 111 mmol/L 100   CO2 22 - 32 mmol/L 27   Calcium 8.9 - 10.3 mg/dL 8.6   Total Protein 6.5 - 8.1 g/dL 7.0   Total Bilirubin 0.3 - 1.2 mg/dL 0.2   Alkaline Phos 38 - 126 U/L 66   AST 15 - 41 U/L 19   ALT 0 - 44 U/L 19       Latest Ref Rng & Units 08/07/2022   11:14 AM  CBC  WBC 4.0 - 10.5 K/uL 10.2   Hemoglobin 12.0 - 15.0 g/dL 16.1   Hematocrit 09.6 - 46.0 % 39.7   Platelets 150 - 400 K/uL 291      Observation/objective: Appears in no acute distress over video visit today.  Breathing is nonlabored  Assessment and plan: Patient is a 47 year old female and this is a routine follow-up visit for iron deficiency anemia  Patient last received IV iron in November 2022.Her most recent CBC was checked on 04/24/2023 when it was 11.4/35.  No iron studies were checked at that time.  Since she is not overtly anemic and asymptomatic as well, we will plan to repeat CBC ferritin and iron studies in 3 months in 7 months and see her back 7 months  Follow-up instructions:as above  I discussed the assessment and treatment plan with the patient. The patient was provided an opportunity to ask questions and all were answered. The patient agreed with the plan and demonstrated an understanding of the instructions.   The patient was advised to call back or seek an in-person evaluation if the symptoms worsen or if the condition fails to improve as anticipated.  I  provided 11 minutes of face-to-face video visit time during this encounter, and > 50% was spent counseling as documented under my assessment & plan.  Visit Diagnosis: 1. Iron deficiency anemia secondary to inadequate dietary iron intake     Dr. Owens Shark, MD, MPH [facility] Tel- [phone] 05/04/2023 8:17 PM

## 2023-08-01 ENCOUNTER — Inpatient Hospital Stay: Payer: 59

## 2023-08-05 ENCOUNTER — Other Ambulatory Visit: Payer: 59

## 2023-08-12 ENCOUNTER — Inpatient Hospital Stay: Payer: 59 | Attending: Oncology

## 2023-08-12 DIAGNOSIS — D519 Vitamin B12 deficiency anemia, unspecified: Secondary | ICD-10-CM | POA: Diagnosis present

## 2023-08-12 DIAGNOSIS — D509 Iron deficiency anemia, unspecified: Secondary | ICD-10-CM | POA: Diagnosis not present

## 2023-08-12 DIAGNOSIS — E538 Deficiency of other specified B group vitamins: Secondary | ICD-10-CM

## 2023-08-12 DIAGNOSIS — D508 Other iron deficiency anemias: Secondary | ICD-10-CM

## 2023-08-12 LAB — CBC WITH DIFFERENTIAL/PLATELET
Abs Immature Granulocytes: 0.06 10*3/uL (ref 0.00–0.07)
Basophils Absolute: 0.1 10*3/uL (ref 0.0–0.1)
Basophils Relative: 1 %
Eosinophils Absolute: 0.1 10*3/uL (ref 0.0–0.5)
Eosinophils Relative: 1 %
HCT: 39.5 % (ref 36.0–46.0)
Hemoglobin: 12.6 g/dL (ref 12.0–15.0)
Immature Granulocytes: 1 %
Lymphocytes Relative: 23 %
Lymphs Abs: 2.2 10*3/uL (ref 0.7–4.0)
MCH: 28.8 pg (ref 26.0–34.0)
MCHC: 31.9 g/dL (ref 30.0–36.0)
MCV: 90.2 fL (ref 80.0–100.0)
Monocytes Absolute: 0.9 10*3/uL (ref 0.1–1.0)
Monocytes Relative: 10 %
Neutro Abs: 6.2 10*3/uL (ref 1.7–7.7)
Neutrophils Relative %: 64 %
Platelets: 309 10*3/uL (ref 150–400)
RBC: 4.38 MIL/uL (ref 3.87–5.11)
RDW: 13.4 % (ref 11.5–15.5)
WBC: 9.6 10*3/uL (ref 4.0–10.5)
nRBC: 0 % (ref 0.0–0.2)

## 2023-08-12 LAB — VITAMIN B12: Vitamin B-12: 223 pg/mL (ref 180–914)

## 2023-08-12 LAB — IRON AND TIBC
Iron: 42 ug/dL (ref 28–170)
Saturation Ratios: 13 % (ref 10.4–31.8)
TIBC: 319 ug/dL (ref 250–450)
UIBC: 277 ug/dL

## 2023-08-12 LAB — FERRITIN: Ferritin: 87 ng/mL (ref 11–307)

## 2023-08-23 ENCOUNTER — Telehealth: Payer: Self-pay | Admitting: *Deleted

## 2023-08-23 NOTE — Telephone Encounter (Signed)
Called both numbers and I got a voicemail for 1 and I left a message that the B12 level was 223 which is in the lower numbers.  Dr. Smith Robert wanted to see if she could take a vitamin B-12 1000 mcg daily or if she does not wanted to do that we could do injections monthly if she wanted to for the B12 .  And she can let me know if she is okay to do that or not and my telephone to call back is [phone]

## 2023-08-29 ENCOUNTER — Telehealth: Payer: Self-pay | Admitting: *Deleted

## 2023-08-29 NOTE — Telephone Encounter (Signed)
Patient called today and she is wanting to start on the B12 injections.  Dr. Smith Robert has sent me a message that she can either take oral B12 or get B12 injections and when I spoke to her this morning she says she would rather do B12 injections at the cancer center.  The first 1 has been assigned on next Monday November 18 at 4:00 that works best for the patient and she would like it to be that time each month.  I told Magin to go ahead and set her up for the injections all the way up to the date that she sees Dr. Smith Robert next.

## 2023-09-02 ENCOUNTER — Inpatient Hospital Stay: Payer: 59 | Attending: Oncology

## 2023-09-02 DIAGNOSIS — D519 Vitamin B12 deficiency anemia, unspecified: Secondary | ICD-10-CM | POA: Insufficient documentation

## 2023-09-02 DIAGNOSIS — D509 Iron deficiency anemia, unspecified: Secondary | ICD-10-CM | POA: Insufficient documentation

## 2023-09-02 DIAGNOSIS — D508 Other iron deficiency anemias: Secondary | ICD-10-CM

## 2023-09-02 MED ORDER — CYANOCOBALAMIN 1000 MCG/ML IJ SOLN
1000.0000 ug | Freq: Once | INTRAMUSCULAR | Status: AC
  Administered 2023-09-02: 1000 ug via INTRAMUSCULAR
  Filled 2023-09-02: qty 1

## 2023-09-30 ENCOUNTER — Inpatient Hospital Stay: Payer: 59 | Attending: Oncology

## 2023-10-04 NOTE — Therapy (Unsigned)
OUTPATIENT PHYSICAL THERAPY LOWER EXTREMITY EVALUATION   Patient Name: Gloria Mcpherson MRN: [id number] DOB:[birth year], 47 y.o., female Today's Date: 10/07/2023  END OF SESSION:   Past Medical History:  Diagnosis Date   Anemia    Anxiety    Hypertension    Lupus    Lupus 2009   Osteoporosis    Past Surgical History:  Procedure Laterality Date   BACK SURGERY     2019 had a laminectomy   CESAREAN SECTION  07/2014   CHOLECYSTECTOMY     CHOLECYSTECTOMY  05/2020   Patient Active Problem List   Diagnosis Date Noted   B12 deficiency 12/20/2020   Iron deficiency anemia 12/13/2020   Anemia 12/12/2020   Raynaud's disease without gangrene 02/27/2017   Radiculopathy of lumbar region 02/27/2017   UTI (urinary tract infection) 12/28/2015   Uterine fibroids affecting pregnancy 01/05/2014   Pregnancy resulting from assisted reproductive technology 01/05/2014   Systemic lupus erythematosus, unspecified (HCC) 08/01/2011   Sjogren syndrome, unspecified (HCC) 08/01/2011    PCP: Deatra James, MD  REFERRING PROVIDER: Deatra James, MD  REFERRING DIAG: [phone] (ICD-10-CM) - Pain in left hip  THERAPY DIAG:  Pain in left hip  Muscle weakness (generalized)  Rationale for Evaluation and Treatment: Rehabilitation  ONSET DATE: chronic  SUBJECTIVE:   SUBJECTIVE STATEMENT: Describes L hip pain over 2 month period.  Symptoms began following a bending over episode where she felt a distinct "pop".  PERTINENT HISTORY: Discussed referral for physical therapy. If the patient does not respond to PT, we may consider imaging and/or evaluation by an orthopedist.  PAIN:  Are you having pain? Yes: NPRS scale: 5-6/10 Pain location: L hip Pain description: ache Aggravating factors: walking,  Relieving factors: undetermined  PRECAUTIONS: None  RED FLAGS: None   WEIGHT BEARING RESTRICTIONS: No  FALLS:  Has patient fallen in last 6 months? No  OCCUPATION: administrative  work  PLOF: Independent  PATIENT GOALS: To manage my hip problems  NEXT MD VISIT: TBD  OBJECTIVE:  Note: Objective measures were completed at Evaluation unless otherwise noted.  DIAGNOSTIC FINDINGS: none  PATIENT SURVEYS:  FOTO 48(61 predicted)  MUSCLE LENGTH: Hamstrings: Right 90 deg; Left 90 deg Thomas test: negative  POSTURE: No Significant postural limitations  PALPATION: TTP L piriformis   LOWER EXTREMITY ROM:  Active ROM Right eval Left eval  Hip flexion    Hip extension    Hip abduction    Hip adduction    Hip internal rotation    Hip external rotation    Knee flexion    Knee extension    Ankle dorsiflexion    Ankle plantarflexion    Ankle inversion    Ankle eversion     (Blank rows = not tested)  LOWER EXTREMITY MMT:  MMT Right eval Left eval  Hip flexion    Hip extension    Hip abduction    Hip adduction    Hip internal rotation    Hip external rotation    Knee flexion    Knee extension    Ankle dorsiflexion    Ankle plantarflexion    Ankle inversion    Ankle eversion     (Blank rows = not tested)  LOWER EXTREMITY SPECIAL TESTS:  Hip special tests: Luisa Hart (FABER) test: negative, Thomas test: negative, Ober's test: negative, Anterior hip impingement test: negative, and Piriformis test: negative  FUNCTIONAL TESTS:  30 seconds chair stand test  11 reps  GAIT: Distance walked: 65ft x2 Assistive device utilized: None  Level of assistance: Complete Independence Comments: unremarkable                                                                                                                                TREATMENT DATE:  10/07/23 Eval and HEP    PATIENT EDUCATION:  Education details: Discussed eval findings, rehab rationale and POC and patient is in agreement  Person educated: Patient Education method: Explanation Education comprehension: verbalized understanding and needs further education  HOME EXERCISE PROGRAM: Access  Code: [email or web address] URL: [email or web address] Date: 10/07/2023 Prepared by: Gustavus Bryant  Exercises - Clamshell  - 1 x daily - 5 x weekly - 2 sets - 15 reps - Supine Figure 4 Piriformis Stretch  - 1 x daily - 5 x weekly - 1 sets - 2 reps - 30s hold  ASSESSMENT:  CLINICAL IMPRESSION: Patient is a 47 y.o. female who was seen today for physical therapy evaluation and treatment for L hip pain. Patient presents with full and painless hip ROM, no flexibility deficits, good strength throughout but TTP at L piriformis and gluteus medius/TFL regions, suggesting TP pathology or underlying inflammation/bursits.  OBJECTIVE IMPAIRMENTS: decreased activity tolerance, decreased knowledge of condition, increased muscle spasms, and pain.   ACTIVITY LIMITATIONS: carrying, lifting, bending, and walking  PERSONAL FACTORS: Age, Fitness, and Past/current experiences are also affecting patient's functional outcome.   REHAB POTENTIAL: Good  CLINICAL DECISION MAKING: Stable/uncomplicated  EVALUATION COMPLEXITY: Low   GOALS: Goals reviewed with patient? No  SHORT TERM GOALS: Target date: 10/28/2023   Patient to demonstrate independence in HEP  Baseline: [id number] Goal status: INITIAL    LONG TERM GOALS: Target date: 11/18/2023    Patient will acknowledge 2/10 pain at least once during episode of care   Baseline: 5-6/10 Goal status: INITIAL  2.  Patient will increase 30s chair stand reps from 11 to 14 without arms to demonstrate and improved functional ability with less pain/difficulty as well as reduce fall risk.  Baseline: 11 Goal status: INITIAL  3.  Patient will score at least 61% on FOTO to signify clinically meaningful improvement in functional abilities.   Baseline: 46 Goal status: INITIAL  4.  Decrease TTP L piriformis to minimal Baseline: Moderate TTP  Goal status: INITIAL     PLAN:  PT FREQUENCY: 1-2x/week  PT DURATION: 6 weeks  PLANNED INTERVENTIONS: 97164-  PT Re-evaluation, 97110-Therapeutic exercises, 97530- Therapeutic activity, 97112- Neuromuscular re-education, 97535- Self Care, 64403- Manual therapy, 97116- Gait training, [phone]- Aquatic Therapy, Dry Needling, Joint mobilization, Spinal mobilization, Cryotherapy, and Moist heat  PLAN FOR NEXT SESSION: HEP review and update, manual techniques as appropriate, aerobic tasks, ROM and flexibility activities, strengthening and PREs, TPDN, gait and balance training as needed     Hildred Laser, PT 10/07/2023, 11:50 AM

## 2023-10-07 ENCOUNTER — Ambulatory Visit: Payer: 59 | Attending: Family Medicine

## 2023-10-07 ENCOUNTER — Other Ambulatory Visit: Payer: Self-pay

## 2023-10-07 DIAGNOSIS — M25552 Pain in left hip: Secondary | ICD-10-CM | POA: Diagnosis present

## 2023-10-07 DIAGNOSIS — M6281 Muscle weakness (generalized): Secondary | ICD-10-CM | POA: Diagnosis present

## 2023-10-07 NOTE — Addendum Note (Signed)
Addended by: Hildred Laser on: 10/07/2023 12:47 PM   Modules accepted: Orders

## 2023-10-08 ENCOUNTER — Encounter (HOSPITAL_COMMUNITY): Payer: Self-pay

## 2023-10-08 ENCOUNTER — Other Ambulatory Visit: Payer: Self-pay

## 2023-10-08 ENCOUNTER — Emergency Department (HOSPITAL_COMMUNITY)
Admission: EM | Admit: 2023-10-08 | Discharge: 2023-10-08 | Disposition: A | Discharge status: Home / Self Care | Payer: 59 | Attending: Emergency Medicine | Admitting: Emergency Medicine

## 2023-10-08 DIAGNOSIS — I10 Essential (primary) hypertension: Secondary | ICD-10-CM | POA: Insufficient documentation

## 2023-10-08 DIAGNOSIS — Y93G1 Activity, food preparation and clean up: Secondary | ICD-10-CM | POA: Insufficient documentation

## 2023-10-08 DIAGNOSIS — Z79899 Other long term (current) drug therapy: Secondary | ICD-10-CM | POA: Diagnosis not present

## 2023-10-08 DIAGNOSIS — W260XXA Contact with knife, initial encounter: Secondary | ICD-10-CM | POA: Insufficient documentation

## 2023-10-08 DIAGNOSIS — S61215A Laceration without foreign body of left ring finger without damage to nail, initial encounter: Secondary | ICD-10-CM | POA: Insufficient documentation

## 2023-10-08 DIAGNOSIS — S6992XA Unspecified injury of left wrist, hand and finger(s), initial encounter: Secondary | ICD-10-CM | POA: Diagnosis present

## 2023-10-08 NOTE — ED Provider Notes (Signed)
[place] EMERGENCY DEPARTMENT AT [facility] Provider Note   CSN: [id number] Arrival date & time: 10/08/23  1818     History  No chief complaint on file.   Gloria Mcpherson is a 47 y.o. female with PMHx lupus, HTN, anxiety who presents to ED concerned for laceration of 3rd right finger. Patient was cutting veggies today and states that her sharp knife slipped and cut her. Patient does not have any concern for foreign bodies and would like to forego imaging today. Denies any other symptoms today. Reports last tetanus dose 30 months ago.  HPI     Home Medications Prior to Admission medications   Medication Sig Start Date End Date Taking? Authorizing Provider  acetaminophen (TYLENOL) 500 MG tablet Take 500 mg by mouth every 6 (six) hours as needed.    [provider]  alendronate (FOSAMAX) 70 MG tablet Take 70 mg by mouth.    [provider]  amitriptyline (ELAVIL) 100 MG tablet Take 100 mg by mouth at bedtime. 05/18/21   [provider]  amLODipine-valsartan (EXFORGE) 5-320 MG tablet Take 1 tablet by mouth daily. 05/09/21   [provider]  amLODIPine-Valsartan-HCTZ 10-160-12.5 MG TABS     [provider]  dicyclomine (BENTYL) 10 MG capsule Take 10 mg by mouth every 6 (six) hours as needed. 11/23/20   [provider]  Digestive Enzymes TABS Take by mouth daily.    [provider]  dronabinol (MARINOL) 5 MG capsule Take by mouth. Patient takes twice a day    [provider]  escitalopram (LEXAPRO) 10 MG tablet Take 15 mg by mouth daily. 1 and 1/2 tabs daily    [provider]  famotidine (PEPCID) 40 MG tablet Take by mouth. 09/28/20   [provider]  hydrochlorothiazide (MICROZIDE) 12.5 MG capsule Take 12.5 mg by mouth daily.    [provider]  HYDROcodone-acetaminophen (NORCO/VICODIN) 5-325 MG tablet Take 1 tablet by mouth every 6 (six) hours as needed. 05/30/21    [provider]  hydroxychloroquine (PLAQUENIL) 200 MG tablet Take 200 mg by mouth daily. 12/18/13   [provider]  LORazepam (ATIVAN) 1 MG tablet Take 1 mg by mouth as needed. 01/26/16   [provider]  naproxen sodium (ALEVE) 220 MG tablet Take by mouth.    [provider]  NON FORMULARY Take by mouth 3 (three) times daily with meals.    [provider]  predniSONE (DELTASONE) 10 MG tablet Take 5 mg by mouth at bedtime as needed (take 5-20mg , as needed).    [provider]  prochlorperazine (COMPAZINE) 5 MG tablet  09/12/16   [provider]  RABEprazole (ACIPHEX) 20 MG tablet Take 20 mg by mouth daily. 01/04/16   [provider]  Syringe/Needle, Disp, (SYRINGE 3CC/25GX1") 25G X 1" 3 ML MISC 1 mL by Does not apply route every 30 (thirty) days. 08/02/21   Creig Hines, MD      Allergies    Cat hair extract    Review of Systems   Review of Systems  Skin:  Positive for wound.    Physical Exam Updated Vital Signs BP (!) 143/107   Pulse (!) 101   Temp 98 F (36.7 C)   Resp 18   Ht 5\' 3"  (1.6 m)   Wt 95.8 kg   SpO2 99%   BMI 37.41 kg/m  Physical Exam Vitals and nursing note reviewed.  Constitutional:      General: She  is not in acute distress.    Appearance: She is not ill-appearing or toxic-appearing.  HENT:     Head: Normocephalic and atraumatic.  Eyes:     General: No scleral icterus.       Right eye: No discharge.        Left eye: No discharge.     Conjunctiva/sclera: Conjunctivae normal.  Cardiovascular:     Rate and Rhythm: Normal rate.  Pulmonary:     Effort: Pulmonary effort is normal.  Abdominal:     General: Abdomen is flat.  Skin:    General: Skin is warm and dry.     Comments: <1cm laceration on posterior side of 3rd left finger. Bleeding controlled with pressure. Mild subjective numbness of tip of finger. Brisk capillary refill. +2 radial pulse. Finger ROM intact.   Neurological:      General: No focal deficit present.     Mental Status: She is alert. Mental status is at baseline.  Psychiatric:        Mood and Affect: Mood normal.        Behavior: Behavior normal.     ED Results / Procedures / Treatments   Labs (all labs ordered are listed, but only abnormal results are displayed) Labs Reviewed - No data to display  EKG None  Radiology No results found.  Procedures Procedures    Medications Ordered in ED Medications - No data to display  ED Course/ Medical Decision Making/ A&P                                 Medical Decision Making  This patient presents to the ED for concern of a  <1 cm simple laceration to their right third finger, this involves an extensive number of treatment options, and is a complaint that carries with it a high risk of complications and morbidity.  The differential diagnosis includes foreign body, fracture, NV compromise. These are considered less likely due to history of present illness and physical exam findings.   Co morbidities that complicate the patient evaluation  none   Additional history obtained:  Dr. Wynelle Link PCP    Problem List / ED Course / Critical interventions / Medication management  Patient presented for a <1 cm laceration to their left third finger. They are neurovascularly intact. Tetanus is already up-to-date. Patient is in no distress.  Laceration is too small to warrant sutures at this time.  I was able to clean wound with iodine and saline and Dermabond.  Laceration repaired without complication and with pulse, motor, sensation intact.  Recommended following up with PCP.  All laceration education provided in verbal form with additional information in written form. Time was allowed for answering of patient questions.  I have reviewed the patients home medicines and have made adjustments as needed Patient was given return precautions. Patient stable for discharge at this time.  Patient verbalized  understanding of plan.   Social Determinants of Health:  none           Final Clinical Impression(s) / ED Diagnoses Final diagnoses:  Laceration of left ring finger without foreign body without damage to nail, initial encounter    Rx / DC Orders ED Discharge Orders     None         Margarita Rana 10/08/23 2214    Alvira Monday, MD 10/09/23 [phone]

## 2023-10-08 NOTE — Discharge Instructions (Signed)
It was a pleasure caring for today.  Please follow-up with your primary care provider.  Seek emergency care if experiencing any new or worsening symptoms.

## 2023-10-08 NOTE — ED Triage Notes (Signed)
Pt cut her left third finger while cutting vegetables. Pt has approx 0.5 in lac on posterior tip of 3rd right finger. Bleeding is controlled with bandage.

## 2023-10-23 ENCOUNTER — Ambulatory Visit: Payer: 59 | Attending: Family Medicine

## 2023-10-23 DIAGNOSIS — M25552 Pain in left hip: Secondary | ICD-10-CM | POA: Insufficient documentation

## 2023-10-23 DIAGNOSIS — M6281 Muscle weakness (generalized): Secondary | ICD-10-CM | POA: Insufficient documentation

## 2023-10-23 NOTE — Therapy (Signed)
 OUTPATIENT PHYSICAL THERAPY TREATMENT NOTE   Patient Name: Gloria Mcpherson MRN: [id number] DOB:Sep 21, 1976, 48 y.o., female Today's Date: 10/23/2023  END OF SESSION:  PT End of Session - 10/23/23 1400     Visit Number 2    Number of Visits 12    Date for PT Re-Evaluation 12/08/23    Authorization Type [employer]    PT Start Time 1400    PT Stop Time 1439    PT Time Calculation (min) 39 min    Activity Tolerance Patient tolerated treatment well    Behavior During Therapy WFL for tasks assessed/performed             Past Medical History:  Diagnosis Date   Anemia    Anxiety    Hypertension    Lupus    Lupus 2009   Osteoporosis    Past Surgical History:  Procedure Laterality Date   BACK SURGERY     2019 had a laminectomy   CESAREAN SECTION  07/2014   CHOLECYSTECTOMY     CHOLECYSTECTOMY  05/2020   Patient Active Problem List   Diagnosis Date Noted   B12 deficiency 12/20/2020   Iron  deficiency anemia 12/13/2020   Anemia 12/12/2020   Raynaud's disease without gangrene 02/27/2017   Radiculopathy of lumbar region 02/27/2017   UTI (urinary tract infection) 12/28/2015   Uterine fibroids affecting pregnancy 01/05/2014   Pregnancy resulting from assisted reproductive technology 01/05/2014   Systemic lupus erythematosus, unspecified (HCC) 08/01/2011   Sjogren syndrome, unspecified (HCC) 08/01/2011    PCP: Sun, Vyvyan, MD  REFERRING PROVIDER: Sun, Vyvyan, MD  REFERRING DIAG: [phone] (ICD-10-CM) - Pain in left hip  THERAPY DIAG:  Pain in left hip  Muscle weakness (generalized)  Rationale for Evaluation and Treatment: Rehabilitation  ONSET DATE: chronic  SUBJECTIVE:   SUBJECTIVE STATEMENT: Patient reports that her hip pain continues, and that her Rt hip is hurting now as well. She states that the Rt hip started hurting about two weeks ago.   EVAL: Describes L hip pain over 2 month period.  Symptoms began following a bending over episode where she felt a  distinct pop.  PERTINENT HISTORY: Discussed referral for physical therapy. If the patient does not respond to PT, we may consider imaging and/or evaluation by an orthopedist.  PAIN:  Are you having pain? Yes: NPRS scale: 5-6/10 Pain location: L hip Pain description: ache Aggravating factors: walking,  Relieving factors: undetermined  PRECAUTIONS: None  RED FLAGS: None   WEIGHT BEARING RESTRICTIONS: No  FALLS:  Has patient fallen in last 6 months? No  OCCUPATION: administrative work  PLOF: Independent  PATIENT GOALS: To manage my hip problems  NEXT MD VISIT: TBD  OBJECTIVE:  Note: Objective measures were completed at Evaluation unless otherwise noted.  DIAGNOSTIC FINDINGS: none  PATIENT SURVEYS:  FOTO 48(61 predicted)  MUSCLE LENGTH: Hamstrings: Right 90 deg; Left 90 deg Thomas test: negative  POSTURE: No Significant postural limitations  PALPATION: TTP L piriformis   LOWER EXTREMITY ROM:  Active ROM Right eval Left eval  Hip flexion    Hip extension    Hip abduction    Hip adduction    Hip internal rotation    Hip external rotation    Knee flexion    Knee extension    Ankle dorsiflexion    Ankle plantarflexion    Ankle inversion    Ankle eversion     (Blank rows = not tested)  LOWER EXTREMITY MMT:  MMT Right eval Left eval  Hip flexion    Hip extension    Hip abduction    Hip adduction    Hip internal rotation    Hip external rotation    Knee flexion    Knee extension    Ankle dorsiflexion    Ankle plantarflexion    Ankle inversion    Ankle eversion     (Blank rows = not tested)  LOWER EXTREMITY SPECIAL TESTS:  Hip special tests: Belvie (FABER) test: negative, Thomas test: negative, Ober's test: negative, Anterior hip impingement test: negative, and Piriformis test: negative  FUNCTIONAL TESTS:  30 seconds chair stand test  11 reps  GAIT: Distance walked: 47ft x2 Assistive device utilized: None Level of assistance:  Complete Independence Comments: unremarkable                                                                                                                               TREATMENT DATE:  [facility] Adult PT Treatment:                                                DATE: 10/23/23 Therapeutic Exercise: Nustep level 5 x 5 mins Seated figure 4 piriformis stretch 2x30 BIL STS with 10# KB 2x10 Sidelying clamshell RTB 2x10 BIL Supine figure 4 piriformis stretch push/pull BIL 2x30 ea BIL Prone quad stretch 2x30 BIL   10/07/23 Eval and HEP    PATIENT EDUCATION:  Education details: Discussed eval findings, rehab rationale and POC and patient is in agreement  Person educated: Patient Education method: Explanation Education comprehension: verbalized understanding and needs further education  HOME EXERCISE PROGRAM: Access Code: [email or web address] URL: [email or web address] Date: 10/07/2023 Prepared by: Reyes Kohut  Exercises - Clamshell  - 1 x daily - 5 x weekly - 2 sets - 15 reps - Supine Figure 4 Piriformis Stretch  - 1 x daily - 5 x weekly - 1 sets - 2 reps - 30s hold  ASSESSMENT:  CLINICAL IMPRESSION: Patient presents to first follow up PT session reporting continued Lt hip pain and that her Rt hip started hurting as well about 2 weeks ago. Session today focused on hip stretching and strengthening. Patient was able to tolerate all prescribed exercises with no adverse effects. Patient continues to benefit from skilled PT services and should be progressed as able to improve functional independence.   EVAL: Patient is a 48 y.o. female who was seen today for physical therapy evaluation and treatment for L hip pain. Patient presents with full and painless hip ROM, no flexibility deficits, good strength throughout but TTP at L piriformis and gluteus medius/TFL regions, suggesting TP pathology or underlying inflammation/bursits.   OBJECTIVE IMPAIRMENTS: decreased activity tolerance,  decreased knowledge of condition, increased muscle spasms, and pain.   ACTIVITY LIMITATIONS: carrying, lifting, bending, and walking  PERSONAL FACTORS: Age, Fitness, and  Past/current experiences are also affecting patient's functional outcome.   REHAB POTENTIAL: Good  CLINICAL DECISION MAKING: Stable/uncomplicated  EVALUATION COMPLEXITY: Low   GOALS: Goals reviewed with patient? No  SHORT TERM GOALS: Target date: 10/28/2023   Patient to demonstrate independence in HEP  Baseline: [id number] Goal status: INITIAL    LONG TERM GOALS: Target date: 11/18/2023    Patient will acknowledge 2/10 pain at least once during episode of care   Baseline: 5-6/10 Goal status: INITIAL  2.  Patient will increase 30s chair stand reps from 11 to 14 without arms to demonstrate and improved functional ability with less pain/difficulty as well as reduce fall risk.  Baseline: 11 Goal status: INITIAL  3.  Patient will score at least 61% on FOTO to signify clinically meaningful improvement in functional abilities.   Baseline: 46 Goal status: INITIAL  4.  Decrease TTP L piriformis to minimal Baseline: Moderate TTP  Goal status: INITIAL   PLAN:  PT FREQUENCY: 1-2x/week  PT DURATION: 6 weeks  PLANNED INTERVENTIONS: 97164- PT Re-evaluation, 97110-Therapeutic exercises, 97530- Therapeutic activity, [id number]- Neuromuscular re-education, 97535- Self Care, [id number]- Manual therapy, 97116- Gait training, [phone]- Aquatic Therapy, Dry Needling, Joint mobilization, Spinal mobilization, Cryotherapy, and Moist heat  PLAN FOR NEXT SESSION: HEP review and update, manual techniques as appropriate, aerobic tasks, ROM and flexibility activities, strengthening and PREs, TPDN, gait and balance training as needed     Corean Pouch, PTA 10/23/2023, 2:40 PM

## 2023-10-24 NOTE — Therapy (Signed)
 OUTPATIENT PHYSICAL THERAPY TREATMENT NOTE   Patient Name: Gloria Mcpherson MRN: [id number] DOB:06-Dec-1975, 48 y.o., female Today's Date: 10/25/2023  END OF SESSION:  PT End of Session - 10/25/23 0913     Visit Number 3    Number of Visits 12    Date for PT Re-Evaluation 12/08/23    Authorization Type [employer]    PT Start Time 0915    PT Stop Time 0955    PT Time Calculation (min) 40 min    Activity Tolerance Patient tolerated treatment well    Behavior During Therapy [facility] for tasks assessed/performed              Past Medical History:  Diagnosis Date   Anemia    Anxiety    Hypertension    Lupus    Lupus 2009   Osteoporosis    Past Surgical History:  Procedure Laterality Date   BACK SURGERY     2019 had a laminectomy   CESAREAN SECTION  07/2014   CHOLECYSTECTOMY     CHOLECYSTECTOMY  05/2020   Patient Active Problem List   Diagnosis Date Noted   B12 deficiency 12/20/2020   Iron  deficiency anemia 12/13/2020   Anemia 12/12/2020   Raynaud's disease without gangrene 02/27/2017   Radiculopathy of lumbar region 02/27/2017   UTI (urinary tract infection) 12/28/2015   Uterine fibroids affecting pregnancy 01/05/2014   Pregnancy resulting from assisted reproductive technology 01/05/2014   Systemic lupus erythematosus, unspecified (HCC) 08/01/2011   Sjogren syndrome, unspecified (HCC) 08/01/2011    PCP: Sun, Vyvyan, MD  REFERRING PROVIDER: Sun, Vyvyan, MD  REFERRING DIAG: [phone] (ICD-10-CM) - Pain in left hip  THERAPY DIAG:  Pain in left hip  Muscle weakness (generalized)  Rationale for Evaluation and Treatment: Rehabilitation  ONSET DATE: chronic  SUBJECTIVE:   SUBJECTIVE STATEMENT: Patient reports that her hip pain continues, and that her Rt hip is hurting now as well. She states that the Rt hip started hurting about two weeks ago.   EVAL: Describes L hip pain over 2 month period.  Symptoms began following a bending over episode where she  felt a distinct pop.  PERTINENT HISTORY: Discussed referral for physical therapy. If the patient does not respond to PT, we may consider imaging and/or evaluation by an orthopedist.  PAIN:  Are you having pain? Yes: NPRS scale: 5-6/10 Pain location: L hip Pain description: ache Aggravating factors: walking,  Relieving factors: undetermined  PRECAUTIONS: None  RED FLAGS: None   WEIGHT BEARING RESTRICTIONS: No  FALLS:  Has patient fallen in last 6 months? No  OCCUPATION: administrative work  PLOF: Independent  PATIENT GOALS: To manage my hip problems  NEXT MD VISIT: TBD  OBJECTIVE:  Note: Objective measures were completed at Evaluation unless otherwise noted.  DIAGNOSTIC FINDINGS: none  PATIENT SURVEYS:  FOTO 48(61 predicted)  MUSCLE LENGTH: Hamstrings: Right 90 deg; Left 90 deg Thomas test: negative  POSTURE: No Significant postural limitations  PALPATION: TTP L piriformis   LOWER EXTREMITY ROM:  Active ROM Right eval Left eval  Hip flexion    Hip extension    Hip abduction    Hip adduction    Hip internal rotation    Hip external rotation    Knee flexion    Knee extension    Ankle dorsiflexion    Ankle plantarflexion    Ankle inversion    Ankle eversion     (Blank rows = not tested)  LOWER EXTREMITY MMT:  MMT Right eval Left  eval  Hip flexion    Hip extension    Hip abduction    Hip adduction    Hip internal rotation    Hip external rotation    Knee flexion    Knee extension    Ankle dorsiflexion    Ankle plantarflexion    Ankle inversion    Ankle eversion     (Blank rows = not tested)  LOWER EXTREMITY SPECIAL TESTS:  Hip special tests: Belvie (FABER) test: negative, Thomas test: negative, Ober's test: negative, Anterior hip impingement test: negative, and Piriformis test: negative  FUNCTIONAL TESTS:  30 seconds chair stand test  11 reps  GAIT: Distance walked: 32ft x2 Assistive device utilized: None Level of assistance:  Complete Independence Comments: unremarkable                                                                                                                               TREATMENT DATE:  [facility] Adult PT Treatment:                                                DATE: 10/25/23 Therapeutic Exercise: Nustep L4 8 min Supine hip fallouts GTB 15x B, 15/15 unilaterally Bridge against GTB 15x S/L clams GTB 15x B Bridge with ball 15x PPT 3s 10x PPT with alternating marching 10/10 90/90 30s x2 Prone on elbows 2 min Bird dog 10/10  [facility] Adult PT Treatment:                                                DATE: 10/23/23 Therapeutic Exercise: Nustep level 5 x 5 mins Seated figure 4 piriformis stretch 2x30 BIL STS with 10# KB 2x10 Sidelying clamshell RTB 2x10 BIL Supine figure 4 piriformis stretch push/pull BIL 2x30 ea BIL Prone quad stretch 2x30 BIL   10/07/23 Eval and HEP    PATIENT EDUCATION:  Education details: Discussed eval findings, rehab rationale and POC and patient is in agreement  Person educated: Patient Education method: Explanation Education comprehension: verbalized understanding and needs further education  HOME EXERCISE PROGRAM: Access Code: [email or web address] URL: [email or web address] Date: 10/07/2023 Prepared by: Reyes Kohut  Exercises - Clamshell  - 1 x daily - 5 x weekly - 2 sets - 15 reps - Supine Figure 4 Piriformis Stretch  - 1 x daily - 5 x weekly - 1 sets - 2 reps - 30s hold  ASSESSMENT:  CLINICAL IMPRESSION: Today's treatment focused on strength and stability training to lumbopelvic and B hip regions.  Concentration on abductor and gluteus medius.  Re-assessed for flexibility deficits but patient presents with hypermobility vs restriction.  Stability weakness noted with bird dogs as patient drops one hip.  EVAL: Patient is a 48 y.o. female who was seen today for physical therapy evaluation and treatment for L hip pain. Patient presents with full and  painless hip ROM, no flexibility deficits, good strength throughout but TTP at L piriformis and gluteus medius/TFL regions, suggesting TP pathology or underlying inflammation/bursits.   OBJECTIVE IMPAIRMENTS: decreased activity tolerance, decreased knowledge of condition, increased muscle spasms, and pain.   ACTIVITY LIMITATIONS: carrying, lifting, bending, and walking  PERSONAL FACTORS: Age, Fitness, and Past/current experiences are also affecting patient's functional outcome.   REHAB POTENTIAL: Good  CLINICAL DECISION MAKING: Stable/uncomplicated  EVALUATION COMPLEXITY: Low   GOALS: Goals reviewed with patient? No  SHORT TERM GOALS: Target date: 10/28/2023   Patient to demonstrate independence in HEP  Baseline: [email or web address] Goal status: INITIAL    LONG TERM GOALS: Target date: 11/18/2023    Patient will acknowledge 2/10 pain at least once during episode of care   Baseline: 5-6/10 Goal status: INITIAL  2.  Patient will increase 30s chair stand reps from 11 to 14 without arms to demonstrate and improved functional ability with less pain/difficulty as well as reduce fall risk.  Baseline: 11 Goal status: INITIAL  3.  Patient will score at least 61% on FOTO to signify clinically meaningful improvement in functional abilities.   Baseline: 46 Goal status: INITIAL  4.  Decrease TTP L piriformis to minimal Baseline: Moderate TTP  Goal status: INITIAL   PLAN:  PT FREQUENCY: 1-2x/week  PT DURATION: 6 weeks  PLANNED INTERVENTIONS: 97164- PT Re-evaluation, 97110-Therapeutic exercises, 97530- Therapeutic activity, 97112- Neuromuscular re-education, 97535- Self Care, 02859- Manual therapy, 97116- Gait training, [id number]- Aquatic Therapy, Dry Needling, Joint mobilization, Spinal mobilization, Cryotherapy, and Moist heat  PLAN FOR NEXT SESSION: HEP review and update, manual techniques as appropriate, aerobic tasks, ROM and flexibility activities, strengthening and PREs, TPDN, gait and  balance training as needed     Reyes CHRISTELLA Kohut, PT 10/25/2023, 9:56 AM

## 2023-10-25 ENCOUNTER — Ambulatory Visit: Payer: 59

## 2023-10-25 DIAGNOSIS — M25552 Pain in left hip: Secondary | ICD-10-CM | POA: Diagnosis not present

## 2023-10-25 DIAGNOSIS — M6281 Muscle weakness (generalized): Secondary | ICD-10-CM

## 2023-10-26 ENCOUNTER — Ambulatory Visit
Admission: RE | Admit: 2023-10-26 | Discharge: 2023-10-26 | Disposition: A | Discharge status: Home / Self Care | Payer: 59 | Source: Ambulatory Visit | Attending: Family Medicine | Admitting: Family Medicine

## 2023-10-26 ENCOUNTER — Ambulatory Visit (INDEPENDENT_AMBULATORY_CARE_PROVIDER_SITE_OTHER): Payer: 59

## 2023-10-26 VITALS — BP 121/82 | HR 112 | Temp 98.6°F | Resp 15 | Ht 63.0 in | Wt 210.0 lb

## 2023-10-26 DIAGNOSIS — R051 Acute cough: Secondary | ICD-10-CM | POA: Diagnosis not present

## 2023-10-26 DIAGNOSIS — J40 Bronchitis, not specified as acute or chronic: Secondary | ICD-10-CM

## 2023-10-26 LAB — GROUP A STREP BY PCR: Group A Strep by PCR: NOT DETECTED

## 2023-10-26 MED ORDER — AMOXICILLIN-POT CLAVULANATE 875-125 MG PO TABS: 1.0000 | ORAL_TABLET | Freq: Two times a day (BID) | ORAL | 0 refills | Status: DC

## 2023-10-26 MED ORDER — PREDNISONE 50 MG PO TABS: 50.0000 mg | ORAL_TABLET | Freq: Every day | ORAL | 0 refills | Status: AC

## 2023-10-26 MED ORDER — HYDROCOD POLI-CHLORPHE POLI ER 10-8 MG/5ML PO SUER: 5.0000 mL | Freq: Two times a day (BID) | ORAL | 0 refills | Status: DC | PRN

## 2023-10-26 NOTE — ED Provider Notes (Signed)
 [facility]    CSN: [id number] Arrival date & time: 10/26/23  1116      History   Chief Complaint Chief Complaint  Patient presents with   Cough    Appointment   Sore Throat    HPI Gloria Mcpherson is a 48 y.o. female.   HPI  History obtained from the patient. Gloria presents for cough for the past 10 days. Tried lemon and honey tea, DayQuil, NyQuil and Mucinex. Endorses sore throat. No vomiting, new diarrhea, abdominal pain, fever or headache.  Her husband was sick before her but he is better now and she is not.  Has Lupus and Sjorgens.  Take prednisone  5mg  daily  and plaquenil.       Past Medical History:  Diagnosis Date   Anemia    Anxiety    Hypertension    Lupus    Lupus 2009   Osteoporosis     Patient Active Problem List   Diagnosis Date Noted   B12 deficiency 12/20/2020   Iron  deficiency anemia 12/13/2020   Anemia 12/12/2020   Raynaud's disease without gangrene 02/27/2017   Radiculopathy of lumbar region 02/27/2017   UTI (urinary tract infection) 12/28/2015   Uterine fibroids affecting pregnancy 01/05/2014   Pregnancy resulting from assisted reproductive technology 01/05/2014   Systemic lupus erythematosus, unspecified (HCC) 08/01/2011   Sjogren syndrome, unspecified (HCC) 08/01/2011    Past Surgical History:  Procedure Laterality Date   BACK SURGERY     2019 had a laminectomy   CESAREAN SECTION  07/2014   CHOLECYSTECTOMY     CHOLECYSTECTOMY  05/2020    OB History   No obstetric history on file.      Home Medications    Prior to Admission medications   Medication Sig Start Date End Date Taking? Authorizing Provider  amoxicillin -clavulanate (AUGMENTIN ) 875-125 MG tablet Take 1 tablet by mouth every 12 (twelve) hours. 10/26/23  Yes Kristianna Saperstein, DO  chlorpheniramine-HYDROcodone (TUSSIONEX) 10-8 MG/5ML Take 5 mLs by mouth every 12 (twelve) hours as needed. 10/26/23  Yes Blondie Riggsbee, DO  predniSONE   (DELTASONE ) 50 MG tablet Take 1 tablet (50 mg total) by mouth daily for 5 days. 10/26/23 10/31/23 Yes Anjulie Dipierro, DO  acetaminophen (TYLENOL) 500 MG tablet Take 500 mg by mouth every 6 (six) hours as needed.    [provider]  alendronate (FOSAMAX) 70 MG tablet Take 70 mg by mouth.    [provider]  amitriptyline (ELAVIL) 100 MG tablet Take 100 mg by mouth at bedtime. 05/18/21   [provider]  amLODipine-valsartan (EXFORGE) 5-320 MG tablet Take 1 tablet by mouth daily. 05/09/21   [provider]  amLODIPine-Valsartan-HCTZ 10-160-12.5 MG TABS     [provider]  dicyclomine (BENTYL) 10 MG capsule Take 10 mg by mouth every 6 (six) hours as needed. 11/23/20   [provider]  Digestive Enzymes TABS Take by mouth daily.    [provider]  dronabinol (MARINOL) 5 MG capsule Take by mouth. Patient takes twice a day    [provider]  escitalopram (LEXAPRO) 10 MG tablet Take 15 mg by mouth daily. 1 and 1/2 tabs daily    [provider]  famotidine (PEPCID) 40 MG tablet Take by mouth. 09/28/20   [provider]  hydrochlorothiazide (MICROZIDE) 12.5 MG capsule Take 12.5 mg by mouth daily.    [provider]  HYDROcodone-acetaminophen (NORCO/VICODIN) 5-325 MG tablet Take 1 tablet by mouth every 6 (six) hours as needed. 05/30/21  [provider]  hydroxychloroquine (PLAQUENIL) 200 MG tablet Take 200 mg by mouth daily. 12/18/13   [provider]  LORazepam (ATIVAN) 1 MG tablet Take 1 mg by mouth as needed. 01/26/16   [provider]  naproxen sodium (ALEVE) 220 MG tablet Take by mouth.    [provider]  NON FORMULARY Take by mouth 3 (three) times daily with meals.    [provider]  predniSONE  (DELTASONE ) 10 MG tablet Take 5 mg by mouth at bedtime as needed (take 5-20mg , as needed).    [provider]  prochlorperazine (COMPAZINE) 5 MG tablet  09/12/16    [provider]  RABEprazole (ACIPHEX) 20 MG tablet Take 20 mg by mouth daily. 01/04/16   [provider]  Syringe/Needle, Disp, (SYRINGE 3CC/25GX1) 25G X 1 3 ML MISC 1 mL by Does not apply route every 30 (thirty) days. 08/02/21   Melanee Annah BROCKS, MD    Family History Family History  Problem Relation Age of Onset   Hypertension Mother    Hypertension Paternal Uncle    Diabetes Maternal Grandmother    Breast cancer Neg Hx     Social History Social History   Tobacco Use   Smoking status: Never   Smokeless tobacco: Never  Vaping Use   Vaping status: Never Used  Substance Use Topics   Alcohol use: Yes    Alcohol/week: 1.0 standard drink of alcohol    Types: 1 Glasses of wine per week   Drug use: Never     Allergies   Cat hair extract   Review of Systems Review of Systems: negative unless otherwise stated in HPI.      Physical Exam Triage Vital Signs ED Triage Vitals  Encounter Vitals Group     BP      Systolic BP Percentile      Diastolic BP Percentile      Pulse      Resp      Temp      Temp src      SpO2      Weight      Height      Head Circumference      Peak Flow      Pain Score      Pain Loc      Pain Education      Exclude from Growth Chart    No data found.  Updated Vital Signs BP 121/82 (BP Location: Left Arm)   Pulse (!) 112   Temp 98.6 F (37 C) (Oral)   Resp 15   Ht 5' 3 (1.6 m)   Wt 95.3 kg   SpO2 94%   BMI 37.20 kg/m   Visual Acuity Right Eye Distance:   Left Eye Distance:   Bilateral Distance:    Right Eye Near:   Left Eye Near:    Bilateral Near:     Physical Exam GEN:     alert, non-toxic appearing female in no distress    HENT:  mucus membranes moist, oropharyngeal without lesions, mild erythema, no tonsillar hypertrophy or exudates, no nasal discharge, right TM erythematous, left TM cloudy EYES:   pupils equal and reactive, no scleral injection or discharge NECK:  normal ROM, + lymphadenopathy, no  meningismus   RESP:  no increased work of breathing, clear to auscultation bilaterally CVS:   regular rhythm, tachycardic Skin:   warm and dry    UC Treatments / Results  Labs (all labs ordered are listed,  but only abnormal results are displayed) Labs Reviewed  GROUP A STREP BY PCR    EKG   Radiology DG Chest 2 View Result Date: 10/26/2023 CLINICAL DATA:  Cough EXAM: CHEST - 2 VIEW COMPARISON:  02/10/2018 FINDINGS: The heart size and mediastinal contours are within normal limits. Both lungs are clear. The visualized skeletal structures are unremarkable. IMPRESSION: No active cardiopulmonary disease. Electronically Signed   By: Mabel Converse D.O.   On: 10/26/2023 11:56    Procedures Procedures (including critical care time)  Medications Ordered in UC Medications - No data to display  Initial Impression / Assessment and Plan / UC Course  I have reviewed the triage vital signs and the nursing notes.  Pertinent labs & imaging results that were available during my care of the patient were reviewed by me and considered in my medical decision making (see chart for details).      Pt is a 48 y.o. female who presents for 1-2 weeks of cough that is not improving and onset sore throat.  Gloria is afebrile here.  She is tachycardic.  On chart review, tachycardia is not new.  Satting 94% on room air. Overall pt is  non-toxic appearing, well hydrated, without respiratory distress. Pulmonary exam is unremarkable.  Given her cough for 1 to 2 weeks with decreased oxygen saturation imaging immunocompromise recommended chest x-ray and she is agreeable.  COVID, influenza and RSV deferred due to duration of symptoms.  Chest xray personally reviewed by me without focal pneumonia, pleural effusion, cardiomegaly or pneumothorax.  Radiologist impression reviewed.  Treat acute bronchitis with steroids and antibiotics as below.  Tussionex cough syrup given for cough and allow patient to rest.   Typical duration of symptoms discussed.   Sore throat for the past 1 to 2 weeks.  On exam, she has mild erythema with lymphadenopathy.  Strep PCR obtained and was negative.  Return and ED precautions given and patient voiced understanding.  Discussed MDM, treatment plan and plan for follow-up with patient who agrees with plan.     Final Clinical Impressions(s) / UC Diagnoses   Final diagnoses:  Bronchitis     Discharge Instructions      Your strep test is negative.  Your chest x-ray did not show any evidence of pneumonia.  Stop by the pharmacy to pick up your prescriptions.  Follow up with your primary care provider as needed.      ED Prescriptions     Medication Sig Dispense Auth. Provider   amoxicillin -clavulanate (AUGMENTIN ) 875-125 MG tablet Take 1 tablet by mouth every 12 (twelve) hours. 14 tablet Zakyah Yanes, DO   predniSONE  (DELTASONE ) 50 MG tablet Take 1 tablet (50 mg total) by mouth daily for 5 days. 5 tablet Athen Riel, DO   chlorpheniramine-HYDROcodone (TUSSIONEX) 10-8 MG/5ML Take 5 mLs by mouth every 12 (twelve) hours as needed. 115 mL Jaymason Ledesma, DO      I have reviewed the PDMP during this encounter.   Kriste Berth, DO 10/26/23 1209

## 2023-10-26 NOTE — Discharge Instructions (Addendum)
 Your strep test is negative.  Your chest x-ray did not show any evidence of pneumonia.  Stop by the pharmacy to pick up your prescriptions.  Follow up with your primary care provider as needed.

## 2023-10-26 NOTE — ED Triage Notes (Signed)
 Patient c/o sore throat, cough and chest congestion for a week.  Patient denies fevers.

## 2023-10-29 ENCOUNTER — Ambulatory Visit: Payer: 59

## 2023-10-29 DIAGNOSIS — M25552 Pain in left hip: Secondary | ICD-10-CM

## 2023-10-29 DIAGNOSIS — M6281 Muscle weakness (generalized): Secondary | ICD-10-CM

## 2023-10-29 NOTE — Therapy (Signed)
 OUTPATIENT PHYSICAL THERAPY TREATMENT NOTE   Patient Name: Gloria Mcpherson MRN: [id number] DOB:1976/09/12, 48 y.o., female Today's Date: 10/29/2023  END OF SESSION:  PT End of Session - 10/29/23 0913     Visit Number 4    Number of Visits 12    Date for PT Re-Evaluation 12/08/23    Authorization Type [employer]    PT Start Time 0915    PT Stop Time 0955    PT Time Calculation (min) 40 min    Activity Tolerance Patient tolerated treatment well    Behavior During Therapy [facility] for tasks assessed/performed               Past Medical History:  Diagnosis Date   Anemia    Anxiety    Hypertension    Lupus    Lupus 2009   Osteoporosis    Past Surgical History:  Procedure Laterality Date   BACK SURGERY     2019 had a laminectomy   CESAREAN SECTION  07/2014   CHOLECYSTECTOMY     CHOLECYSTECTOMY  05/2020   Patient Active Problem List   Diagnosis Date Noted   B12 deficiency 12/20/2020   Iron  deficiency anemia 12/13/2020   Anemia 12/12/2020   Raynaud's disease without gangrene 02/27/2017   Radiculopathy of lumbar region 02/27/2017   UTI (urinary tract infection) 12/28/2015   Uterine fibroids affecting pregnancy 01/05/2014   Pregnancy resulting from assisted reproductive technology 01/05/2014   Systemic lupus erythematosus, unspecified (HCC) 08/01/2011   Sjogren syndrome, unspecified (HCC) 08/01/2011    PCP: Sun, Vyvyan, MD  REFERRING PROVIDER: Sun, Vyvyan, MD  REFERRING DIAG: [phone] (ICD-10-CM) - Pain in left hip  THERAPY DIAG:  Pain in left hip  Muscle weakness (generalized)  Rationale for Evaluation and Treatment: Rehabilitation  ONSET DATE: chronic  SUBJECTIVE:   SUBJECTIVE STATEMENT: Patient is reporting 4/10 left hip pain today. She states that it feels more like nerve pain  EVAL: Describes L hip pain over 2 month period.  Symptoms began following a bending over episode where she felt a distinct pop.  PERTINENT HISTORY: Discussed  referral for physical therapy. If the patient does not respond to PT, we may consider imaging and/or evaluation by an orthopedist.  PAIN:  Are you having pain? Yes: NPRS scale: 5-6/10 Pain location: L hip Pain description: ache Aggravating factors: walking,  Relieving factors: undetermined  PRECAUTIONS: None  RED FLAGS: None   WEIGHT BEARING RESTRICTIONS: No  FALLS:  Has patient fallen in last 6 months? No  OCCUPATION: administrative work  PLOF: Independent  PATIENT GOALS: To manage my hip problems  NEXT MD VISIT: TBD  OBJECTIVE:  Note: Objective measures were completed at Evaluation unless otherwise noted.  DIAGNOSTIC FINDINGS: none  PATIENT SURVEYS:  FOTO 48(61 predicted)  MUSCLE LENGTH: Hamstrings: Right 90 deg; Left 90 deg Thomas test: negative  POSTURE: No Significant postural limitations  PALPATION: TTP L piriformis   LOWER EXTREMITY ROM:  Active ROM Right eval Left eval  Hip flexion    Hip extension    Hip abduction    Hip adduction    Hip internal rotation    Hip external rotation    Knee flexion    Knee extension    Ankle dorsiflexion    Ankle plantarflexion    Ankle inversion    Ankle eversion     (Blank rows = not tested)  LOWER EXTREMITY MMT:  MMT Right eval Left eval  Hip flexion    Hip extension  Hip abduction    Hip adduction    Hip internal rotation    Hip external rotation    Knee flexion    Knee extension    Ankle dorsiflexion    Ankle plantarflexion    Ankle inversion    Ankle eversion     (Blank rows = not tested)  LOWER EXTREMITY SPECIAL TESTS:  Hip special tests: Belvie (FABER) test: negative, Thomas test: negative, Ober's test: negative, Anterior hip impingement test: negative, and Piriformis test: negative  FUNCTIONAL TESTS:  30 seconds chair stand test  11 reps  GAIT: Distance walked: 55ft x2 Assistive device utilized: None Level of assistance: Complete Independence Comments: unremarkable                                                                                                                                TREATMENT DATE:   [facility] Adult PT Treatment:                                                DATE: 10/29/2023  Therapeutic Exercise: Nustep L4 8 min Supine hip fallouts GTB 15x B, 15/15 unilaterally Bridge against GTB 2 x 10  S/L clams GTB 15x B PPT with alternating marching 10/10 x 2  90/90 30s x2 Bird dog 10/10  [facility] Adult PT Treatment:                                                DATE: 10/25/23 Therapeutic Exercise: Nustep L4 8 min Supine hip fallouts GTB 15x B, 15/15 unilaterally Bridge against GTB 15x S/L clams GTB 15x B Bridge with ball 15x PPT 3s 10x PPT with alternating marching 10/10 90/90 30s x2 Prone on elbows 2 min Bird dog 10/10  [facility] Adult PT Treatment:                                                DATE: 10/23/23 Therapeutic Exercise: Nustep level 5 x 5 mins Seated figure 4 piriformis stretch 2x30 BIL STS with 10# KB 2x10 Sidelying clamshell RTB 2x10 BIL Supine figure 4 piriformis stretch push/pull BIL 2x30 ea BIL Prone quad stretch 2x30 BIL   10/07/23 Eval and HEP    PATIENT EDUCATION:  Education details: Discussed eval findings, rehab rationale and POC and patient is in agreement  Person educated: Patient Education method: Explanation Education comprehension: verbalized understanding and needs further education  HOME EXERCISE PROGRAM: Access Code: [email or web address] URL: [email or web address] Date: 10/07/2023 Prepared by: Jeffrey Ziemba  Exercises - Clamshell  - 1 x daily -  5 x weekly - 2 sets - 15 reps - Supine Figure 4 Piriformis Stretch  - 1 x daily - 5 x weekly - 1 sets - 2 reps - 30s hold  ASSESSMENT:  CLINICAL IMPRESSION:   Today's session had somewhat decreased volume of strengthening exercises per patient tolerance. Additionally she spent increased time on recumbent stepper at start of session for analgesic effects.  She may benefit from elliptical for warm up at next visit.  She reports pain at end of session, but states that it is no worse than beginning of the session.    EVAL: Patient is a 48 y.o. female who was seen today for physical therapy evaluation and treatment for L hip pain. Patient presents with full and painless hip ROM, no flexibility deficits, good strength throughout but TTP at L piriformis and gluteus medius/TFL regions, suggesting TP pathology or underlying inflammation/bursits.   OBJECTIVE IMPAIRMENTS: decreased activity tolerance, decreased knowledge of condition, increased muscle spasms, and pain.   ACTIVITY LIMITATIONS: carrying, lifting, bending, and walking  PERSONAL FACTORS: Age, Fitness, and Past/current experiences are also affecting patient's functional outcome.   REHAB POTENTIAL: Good  CLINICAL DECISION MAKING: Stable/uncomplicated  EVALUATION COMPLEXITY: Low   GOALS: Goals reviewed with patient? No  SHORT TERM GOALS: Target date: 10/28/2023   Patient to demonstrate independence in HEP  Baseline: [id number] Goal status: INITIAL    LONG TERM GOALS: Target date: 11/18/2023    Patient will acknowledge 2/10 pain at least once during episode of care   Baseline: 5-6/10 Goal status: INITIAL  2.  Patient will increase 30s chair stand reps from 11 to 14 without arms to demonstrate and improved functional ability with less pain/difficulty as well as reduce fall risk.  Baseline: 11 Goal status: INITIAL  3.  Patient will score at least 61% on FOTO to signify clinically meaningful improvement in functional abilities.   Baseline: 46 Goal status: INITIAL  4.  Decrease TTP L piriformis to minimal Baseline: Moderate TTP  Goal status: INITIAL   PLAN:  PT FREQUENCY: 1-2x/week  PT DURATION: 6 weeks  PLANNED INTERVENTIONS: 97164- PT Re-evaluation, 97110-Therapeutic exercises, 97530- Therapeutic activity, [id number]- Neuromuscular re-education, 97535- Self Care, [id number]-  Manual therapy, [id number]- Gait training, [phone]- Aquatic Therapy, Dry Needling, Joint mobilization, Spinal mobilization, Cryotherapy, and Moist heat  PLAN FOR NEXT SESSION: HEP review and update, manual techniques as appropriate, aerobic tasks, ROM and flexibility activities, strengthening and PREs, TPDN, gait and balance training as needed     Marko Molt, PT, DPT  10/29/2023 10:05 AM

## 2023-10-31 NOTE — Therapy (Signed)
OUTPATIENT PHYSICAL THERAPY TREATMENT NOTE   Patient Name: Gloria Mcpherson MRN: [id number] DOB:1976-03-26, 48 y.o., female Today's Date: 11/01/2023  END OF SESSION:  PT End of Session - 11/01/23 0917     Visit Number 5    Number of Visits 12    Date for PT Re-Evaluation 12/08/23    Authorization Type [employer]    PT Start Time 0915    PT Stop Time 0955    PT Time Calculation (min) 40 min    Activity Tolerance Patient tolerated treatment well    Behavior During Therapy [facility] for tasks assessed/performed                Past Medical History:  Diagnosis Date   Anemia    Anxiety    Hypertension    Lupus    Lupus 2009   Osteoporosis    Past Surgical History:  Procedure Laterality Date   BACK SURGERY     2019 had a laminectomy   CESAREAN SECTION  07/2014   CHOLECYSTECTOMY     CHOLECYSTECTOMY  05/2020   Patient Active Problem List   Diagnosis Date Noted   B12 deficiency 12/20/2020   Iron deficiency anemia 12/13/2020   Anemia 12/12/2020   Raynaud's disease without gangrene 02/27/2017   Radiculopathy of lumbar region 02/27/2017   UTI (urinary tract infection) 12/28/2015   Uterine fibroids affecting pregnancy 01/05/2014   Pregnancy resulting from assisted reproductive technology 01/05/2014   Systemic lupus erythematosus, unspecified (HCC) 08/01/2011   Sjogren syndrome, unspecified (HCC) 08/01/2011    PCP: Deatra James, MD  REFERRING PROVIDER: Deatra James, MD  REFERRING DIAG: [phone] (ICD-10-CM) - Pain in left hip  THERAPY DIAG:  Pain in left hip  Muscle weakness (generalized)  Rationale for Evaluation and Treatment: Rehabilitation  ONSET DATE: chronic  SUBJECTIVE:   SUBJECTIVE STATEMENT: Reports continued L hip pain, deep in nature.  Worse with extended driving.  EVAL: Describes L hip pain over 2 month period.  Symptoms began following a bending over episode where she felt a distinct "pop".  PERTINENT HISTORY: Discussed referral for physical  therapy. If the patient does not respond to PT, we may consider imaging and/or evaluation by an orthopedist.  PAIN:  Are you having pain? Yes: NPRS scale: 5-6/10 Pain location: L hip Pain description: ache Aggravating factors: walking,  Relieving factors: undetermined  PRECAUTIONS: None  RED FLAGS: None   WEIGHT BEARING RESTRICTIONS: No  FALLS:  Has patient fallen in last 6 months? No  OCCUPATION: administrative work  PLOF: Independent  PATIENT GOALS: To manage my hip problems  NEXT MD VISIT: TBD  OBJECTIVE:  Note: Objective measures were completed at Evaluation unless otherwise noted.  DIAGNOSTIC FINDINGS: none  PATIENT SURVEYS:  FOTO 48(61 predicted)  MUSCLE LENGTH: Hamstrings: Right 90 deg; Left 90 deg Thomas test: negative  POSTURE: No Significant postural limitations  PALPATION: TTP L piriformis   LOWER EXTREMITY ROM:  Active ROM Right eval Left eval  Hip flexion    Hip extension    Hip abduction    Hip adduction    Hip internal rotation    Hip external rotation    Knee flexion    Knee extension    Ankle dorsiflexion    Ankle plantarflexion    Ankle inversion    Ankle eversion     (Blank rows = not tested)  LOWER EXTREMITY MMT:  MMT Right eval Left eval  Hip flexion    Hip extension    Hip abduction  Hip adduction    Hip internal rotation    Hip external rotation    Knee flexion    Knee extension    Ankle dorsiflexion    Ankle plantarflexion    Ankle inversion    Ankle eversion     (Blank rows = not tested)  LOWER EXTREMITY SPECIAL TESTS:  Hip special tests: Luisa Hart (FABER) test: negative, Thomas test: negative, Ober's test: negative, Anterior hip impingement test: negative, and Piriformis test: negative  FUNCTIONAL TESTS:  30 seconds chair stand test  11 reps  GAIT: Distance walked: 90ft x2 Assistive device utilized: None Level of assistance: Complete Independence Comments: unremarkable                                                                                                                                TREATMENT DATE:  [facility] Adult PT Treatment:                                                DATE: 11/01/23 Therapeutic Exercise: Nustep L4 8 min L clams 15x L piriformis stretch 30s x2  Bridge 15x Manual Therapy: Trigger Point Dry Needling  Initial Treatment: Pt instructed on Dry Needling rational, procedures, and possible side effects. Pt instructed to expect mild to moderate muscle soreness later in the day and/or into the next day.  Pt instructed in methods to reduce muscle soreness. Pt instructed to continue prescribed HEP. Patient was educated on signs and symptoms of infection and other risk factors and advised to seek medical attention should they occur.  Patient verbalized understanding of these instructions and education.   Patient Verbal Consent Given: Yes Education Handout Provided: Yes Muscles Treated: L piriformis Electrical Stimulation Performed: No Treatment Response/Outcome: increased twitch response    [facility] Adult PT Treatment:                                                DATE: 10/29/2023  Therapeutic Exercise: Nustep L4 8 min Supine hip fallouts GTB 15x B, 15/15 unilaterally Bridge against GTB 2 x 10  S/L clams GTB 15x B PPT with alternating marching 10/10 x 2  90/90 30s x2 Bird dog 10/10  [facility] Adult PT Treatment:                                                DATE: 10/25/23 Therapeutic Exercise: Nustep L4 8 min Supine hip fallouts GTB 15x B, 15/15 unilaterally Bridge against GTB 15x S/L clams GTB 15x B Bridge with ball 15x PPT 3s 10x PPT  with alternating marching 10/10 90/90 30s x2 Prone on elbows 2 min Bird dog 10/10  [facility] Adult PT Treatment:                                                DATE: 10/23/23 Therapeutic Exercise: Nustep level 5 x 5 mins Seated figure 4 piriformis stretch 2x30" BIL STS with 10# KB 2x10 Sidelying clamshell RTB 2x10 BIL Supine  figure 4 piriformis stretch push/pull BIL 2x30" ea BIL Prone quad stretch 2x30" BIL   10/07/23 Eval and HEP    PATIENT EDUCATION:  Education details: Discussed eval findings, rehab rationale and POC and patient is in agreement  Person educated: Patient Education method: Explanation Education comprehension: verbalized understanding and needs further education  HOME EXERCISE PROGRAM: Access Code: [email or web address] URL: [email or web address] Date: 10/07/2023 Prepared by: Gustavus Bryant  Exercises - Clamshell  - 1 x daily - 5 x weekly - 2 sets - 15 reps - Supine Figure 4 Piriformis Stretch  - 1 x daily - 5 x weekly - 1 sets - 2 reps - 30s hold  ASSESSMENT:  CLINICAL IMPRESSION: Added TPDN to L piriformis to address ongoing symptoms.  Followed DN with stretching and aerobic work to maximize benefit.  Written handout provided regarding DN aftercare.   Today's session had somewhat decreased volume of strengthening exercises per patient tolerance. Additionally she spent increased time on recumbent stepper at start of session for analgesic effects. She may benefit from elliptical for warm up at next visit.  She reports pain at end of session, but states that it is no worse than beginning of the session.    EVAL: Patient is a 48 y.o. female who was seen today for physical therapy evaluation and treatment for L hip pain. Patient presents with full and painless hip ROM, no flexibility deficits, good strength throughout but TTP at L piriformis and gluteus medius/TFL regions, suggesting TP pathology or underlying inflammation/bursits.   OBJECTIVE IMPAIRMENTS: decreased activity tolerance, decreased knowledge of condition, increased muscle spasms, and pain.   ACTIVITY LIMITATIONS: carrying, lifting, bending, and walking  PERSONAL FACTORS: Age, Fitness, and Past/current experiences are also affecting patient's functional outcome.   REHAB POTENTIAL: Good  CLINICAL DECISION MAKING:  Stable/uncomplicated  EVALUATION COMPLEXITY: Low   GOALS: Goals reviewed with patient? No  SHORT TERM GOALS: Target date: 10/28/2023   Patient to demonstrate independence in HEP  Baseline: [id number] Goal status: Met    LONG TERM GOALS: Target date: 11/18/2023    Patient will acknowledge 2/10 pain at least once during episode of care   Baseline: 5-6/10 Goal status: INITIAL  2.  Patient will increase 30s chair stand reps from 11 to 14 without arms to demonstrate and improved functional ability with less pain/difficulty as well as reduce fall risk.  Baseline: 11 Goal status: INITIAL  3.  Patient will score at least 61% on FOTO to signify clinically meaningful improvement in functional abilities.   Baseline: 46 Goal status: INITIAL  4.  Decrease TTP L piriformis to minimal Baseline: Moderate TTP  Goal status: INITIAL   PLAN:  PT FREQUENCY: 1-2x/week  PT DURATION: 6 weeks  PLANNED INTERVENTIONS: 97164- PT Re-evaluation, 97110-Therapeutic exercises, 97530- Therapeutic activity, [id number]- Neuromuscular re-education, 97535- Self Care, [id number]- Manual therapy, [id number]- Gait training, [phone]- Aquatic Therapy, Dry Needling, Joint mobilization, Spinal mobilization, Cryotherapy, and Moist heat  PLAN FOR NEXT SESSION:  HEP review and update, manual techniques as appropriate, aerobic tasks, ROM and flexibility activities, strengthening and PREs, TPDN, gait and balance training as needed     Learta Codding PT  11/01/2023 10:02 AM

## 2023-11-01 ENCOUNTER — Ambulatory Visit: Payer: 59

## 2023-11-01 DIAGNOSIS — M25552 Pain in left hip: Secondary | ICD-10-CM | POA: Diagnosis not present

## 2023-11-01 DIAGNOSIS — M6281 Muscle weakness (generalized): Secondary | ICD-10-CM

## 2023-11-01 NOTE — Patient Instructions (Signed)

## 2023-11-04 ENCOUNTER — Inpatient Hospital Stay: Payer: 59 | Attending: Oncology

## 2023-11-04 DIAGNOSIS — D519 Vitamin B12 deficiency anemia, unspecified: Secondary | ICD-10-CM | POA: Insufficient documentation

## 2023-11-04 DIAGNOSIS — D508 Other iron deficiency anemias: Secondary | ICD-10-CM

## 2023-11-04 MED ORDER — CYANOCOBALAMIN 1000 MCG/ML IJ SOLN
1000.0000 ug | Freq: Once | INTRAMUSCULAR | Status: AC
  Administered 2023-11-04: 1000 ug via INTRAMUSCULAR
  Filled 2023-11-04: qty 1

## 2023-11-04 NOTE — Therapy (Unsigned)
OUTPATIENT PHYSICAL THERAPY TREATMENT NOTE   Patient Name: Gloria Mcpherson MRN: [id number] DOB:1976-03-18, 48 y.o., female Today's Date: 11/05/2023  END OF SESSION:  PT End of Session - 11/05/23 1132     Visit Number 6    Number of Visits 12    Date for PT Re-Evaluation 12/08/23    Authorization Type [employer]    PT Start Time 1130    PT Stop Time 1215    PT Time Calculation (min) 45 min    Activity Tolerance Patient tolerated treatment well    Behavior During Therapy [facility] for tasks assessed/performed                 Past Medical History:  Diagnosis Date   Anemia    Anxiety    Hypertension    Lupus    Lupus 2009   Osteoporosis    Past Surgical History:  Procedure Laterality Date   BACK SURGERY     2019 had a laminectomy   CESAREAN SECTION  07/2014   CHOLECYSTECTOMY     CHOLECYSTECTOMY  05/2020   Patient Active Problem List   Diagnosis Date Noted   B12 deficiency 12/20/2020   Iron deficiency anemia 12/13/2020   Anemia 12/12/2020   Raynaud's disease without gangrene 02/27/2017   Radiculopathy of lumbar region 02/27/2017   UTI (urinary tract infection) 12/28/2015   Uterine fibroids affecting pregnancy 01/05/2014   Pregnancy resulting from assisted reproductive technology 01/05/2014   Systemic lupus erythematosus, unspecified (HCC) 08/01/2011   Sjogren syndrome, unspecified (HCC) 08/01/2011    PCP: Deatra James, MD  REFERRING PROVIDER: Deatra James, MD  REFERRING DIAG: [phone] (ICD-10-CM) - Pain in left hip  THERAPY DIAG:  Pain in left hip  Muscle weakness (generalized)  Rationale for Evaluation and Treatment: Rehabilitation  ONSET DATE: chronic  SUBJECTIVE:   SUBJECTIVE STATEMENT: Reported 2-3 days soreness following previous session but symptoms resolved and notes relief of symptoms.  Agreeable to repeat treatment due positive results  EVAL: Describes L hip pain over 2 month period.  Symptoms began following a bending over episode  where she felt a distinct "pop".  PERTINENT HISTORY: Discussed referral for physical therapy. If the patient does not respond to PT, we may consider imaging and/or evaluation by an orthopedist.  PAIN:  Are you having pain? Yes: NPRS scale: 5-6/10 Pain location: L hip Pain description: ache Aggravating factors: walking,  Relieving factors: undetermined  PRECAUTIONS: None  RED FLAGS: None   WEIGHT BEARING RESTRICTIONS: No  FALLS:  Has patient fallen in last 6 months? No  OCCUPATION: administrative work  PLOF: Independent  PATIENT GOALS: To manage my hip problems  NEXT MD VISIT: TBD  OBJECTIVE:  Note: Objective measures were completed at Evaluation unless otherwise noted.  DIAGNOSTIC FINDINGS: none  PATIENT SURVEYS:  FOTO 48(61 predicted) 11/05/23 59%   MUSCLE LENGTH: Hamstrings: Right 90 deg; Left 90 deg Thomas test: negative  POSTURE: No Significant postural limitations  PALPATION: TTP L piriformis   LOWER EXTREMITY ROM:  Active ROM Right eval Left eval  Hip flexion    Hip extension    Hip abduction    Hip adduction    Hip internal rotation    Hip external rotation    Knee flexion    Knee extension    Ankle dorsiflexion    Ankle plantarflexion    Ankle inversion    Ankle eversion     (Blank rows = not tested)  LOWER EXTREMITY MMT:  MMT Right eval Left eval  Hip flexion    Hip extension    Hip abduction    Hip adduction    Hip internal rotation    Hip external rotation    Knee flexion    Knee extension    Ankle dorsiflexion    Ankle plantarflexion    Ankle inversion    Ankle eversion     (Blank rows = not tested)  LOWER EXTREMITY SPECIAL TESTS:  Hip special tests: Luisa Hart (FABER) test: negative, Thomas test: negative, Ober's test: negative, Anterior hip impingement test: negative, and Piriformis test: negative  FUNCTIONAL TESTS:  30 seconds chair stand test  11 reps  GAIT: Distance walked: 26ft x2 Assistive device utilized:  None Level of assistance: Complete Independence Comments: unremarkable                                                                                                                               TREATMENT DATE:  [facility] Adult PT Treatment:                                                DATE: 11/05/23 Therapeutic Exercise: Nustep L4 8 min L clams GTB 15x2 Bridge GTB 15x2 Manual Therapy: Skilled palpation to identify taught and irritable bands in L piriformis Trigger Point Dry Needling  Subsequent Treatment: Instructions provided previously at initial dry needling treatment.   Patient Verbal Consent Given: Yes Education Handout Provided: Previously Provided Muscles Treated: L piriformis Electrical Stimulation Performed: No Treatment Response/Outcome: pain/point tenderness reduced      [facility] Adult PT Treatment:                                                DATE: 11/01/23 Therapeutic Exercise: Nustep L4 8 min L clams 15x Bridge 15x L piriformis stretch 30s x2 Manual Therapy: Trigger Point Dry Needling  Initial Treatment: Pt instructed on Dry Needling rational, procedures, and possible side effects. Pt instructed to expect mild to moderate muscle soreness later in the day and/or into the next day.  Pt instructed in methods to reduce muscle soreness. Pt instructed to continue prescribed HEP. Patient was educated on signs and symptoms of infection and other risk factors and advised to seek medical attention should they occur.  Patient verbalized understanding of these instructions and education.   Patient Verbal Consent Given: Yes Education Handout Provided: Yes Muscles Treated: L piriformis Electrical Stimulation Performed: No Treatment Response/Outcome: increased twitch response    [facility] Adult PT Treatment:  DATE: 10/29/2023  Therapeutic Exercise: Nustep L4 8 min Supine hip fallouts GTB 15x B, 15/15 unilaterally Bridge against  GTB 2 x 10  S/L clams GTB 15x B PPT with alternating marching 10/10 x 2  90/90 30s x2 Bird dog 10/10  [facility] Adult PT Treatment:                                                DATE: 10/25/23 Therapeutic Exercise: Nustep L4 8 min Supine hip fallouts GTB 15x B, 15/15 unilaterally Bridge against GTB 15x S/L clams GTB 15x B Bridge with ball 15x PPT 3s 10x PPT with alternating marching 10/10 90/90 30s x2 Prone on elbows 2 min Bird dog 10/10  [facility] Adult PT Treatment:                                                DATE: 10/23/23 Therapeutic Exercise: Nustep level 5 x 5 mins Seated figure 4 piriformis stretch 2x30" BIL STS with 10# KB 2x10 Sidelying clamshell RTB 2x10 BIL Supine figure 4 piriformis stretch push/pull BIL 2x30" ea BIL Prone quad stretch 2x30" BIL   10/07/23 Eval and HEP    PATIENT EDUCATION:  Education details: Discussed eval findings, rehab rationale and POC and patient is in agreement  Person educated: Patient Education method: Explanation Education comprehension: verbalized understanding and needs further education  HOME EXERCISE PROGRAM: Access Code: [email or web address] URL: [email or web address] Date: 10/07/2023 Prepared by: Gustavus Bryant  Exercises - Clamshell  - 1 x daily - 5 x weekly - 2 sets - 15 reps - Supine Figure 4 Piriformis Stretch  - 1 x daily - 5 x weekly - 1 sets - 2 reps - 30s hold  ASSESSMENT:  CLINICAL IMPRESSION: Good response to TPDN last session and agreeable to repeat procedure.  FOTO score increased but goal not yet met.  Marked response noted with DN today as piriformis particularly reactive today during treatment.  Increased resistance and difficulty as noted.  Today's session had somewhat decreased volume of strengthening exercises per patient tolerance. Additionally she spent increased time on recumbent stepper at start of session for analgesic effects. She may benefit from elliptical for warm up at next visit.  She reports pain  at end of session, but states that it is no worse than beginning of the session.    EVAL: Patient is a 48 y.o. female who was seen today for physical therapy evaluation and treatment for L hip pain. Patient presents with full and painless hip ROM, no flexibility deficits, good strength throughout but TTP at L piriformis and gluteus medius/TFL regions, suggesting TP pathology or underlying inflammation/bursits.   OBJECTIVE IMPAIRMENTS: decreased activity tolerance, decreased knowledge of condition, increased muscle spasms, and pain.   ACTIVITY LIMITATIONS: carrying, lifting, bending, and walking  PERSONAL FACTORS: Age, Fitness, and Past/current experiences are also affecting patient's functional outcome.   REHAB POTENTIAL: Good  CLINICAL DECISION MAKING: Stable/uncomplicated  EVALUATION COMPLEXITY: Low   GOALS: Goals reviewed with patient? No  SHORT TERM GOALS: Target date: 10/28/2023   Patient to demonstrate independence in HEP  Baseline: [id number] Goal status: Met    LONG TERM GOALS: Target date: 11/18/2023    Patient will acknowledge 2/10 pain at least once during episode  of care   Baseline: 5-6/10 Goal status: INITIAL  2.  Patient will increase 30s chair stand reps from 11 to 14 without arms to demonstrate and improved functional ability with less pain/difficulty as well as reduce fall risk.  Baseline: 11 Goal status: INITIAL  3.  Patient will score at least 61% on FOTO to signify clinically meaningful improvement in functional abilities.   Baseline: 46 Goal status: INITIAL  4.  Decrease TTP L piriformis to minimal Baseline: Moderate TTP  Goal status: INITIAL   PLAN:  PT FREQUENCY: 1-2x/week  PT DURATION: 6 weeks  PLANNED INTERVENTIONS: 97164- PT Re-evaluation, 97110-Therapeutic exercises, 97530- Therapeutic activity, 97112- Neuromuscular re-education, 97535- Self Care, [id number]- Manual therapy, [id number]- Gait training, [phone]- Aquatic Therapy, Dry Needling, Joint  mobilization, Spinal mobilization, Cryotherapy, and Moist heat  PLAN FOR NEXT SESSION: HEP review and update, manual techniques as appropriate, aerobic tasks, ROM and flexibility activities, strengthening and PREs, TPDN, gait and balance training as needed     Learta Codding PT  11/05/2023 12:07 PM

## 2023-11-05 ENCOUNTER — Ambulatory Visit: Payer: 59

## 2023-11-05 DIAGNOSIS — M25552 Pain in left hip: Secondary | ICD-10-CM | POA: Diagnosis not present

## 2023-11-05 DIAGNOSIS — M6281 Muscle weakness (generalized): Secondary | ICD-10-CM

## 2023-11-12 NOTE — Therapy (Unsigned)
OUTPATIENT PHYSICAL THERAPY TREATMENT NOTE/DISCHARGE   Patient Name: Gloria Mcpherson MRN: [id number] DOB:[birth year], 48 y.o., female Today's Date: 11/13/2023 PHYSICAL THERAPY DISCHARGE SUMMARY  Visits from Start of Care: 7  Current functional level related to goals / functional outcomes: Goals met   Remaining deficits: Minimal pain   Education / Equipment: HEP   Patient agrees to discharge. Patient goals were met. Patient is being discharged due to being pleased with the current functional level.  END OF SESSION:  PT End of Session - 11/13/23 1133     Visit Number 7    Number of Visits 12    Date for PT Re-Evaluation 12/08/23    Authorization Type [employer]    PT Start Time 1130    PT Stop Time 1210    PT Time Calculation (min) 40 min    Activity Tolerance Patient tolerated treatment well    Behavior During Therapy [facility] for tasks assessed/performed                  Past Medical History:  Diagnosis Date   Anemia    Anxiety    Hypertension    Lupus    Lupus 2009   Osteoporosis    Past Surgical History:  Procedure Laterality Date   BACK SURGERY     2019 had a laminectomy   CESAREAN SECTION  07/2014   CHOLECYSTECTOMY     CHOLECYSTECTOMY  05/2020   Patient Active Problem List   Diagnosis Date Noted   B12 deficiency 12/20/2020   Iron deficiency anemia 12/13/2020   Anemia 12/12/2020   Raynaud's disease without gangrene 02/27/2017   Radiculopathy of lumbar region 02/27/2017   UTI (urinary tract infection) 12/28/2015   Uterine fibroids affecting pregnancy 01/05/2014   Pregnancy resulting from assisted reproductive technology 01/05/2014   Systemic lupus erythematosus, unspecified (HCC) 08/01/2011   Sjogren syndrome, unspecified (HCC) 08/01/2011    PCP: Deatra James, MD  REFERRING PROVIDER: Deatra James, MD  REFERRING DIAG: [phone] (ICD-10-CM) - Pain in left hip  THERAPY DIAG:  Pain in left hip  Muscle weakness (generalized)  Rationale  for Evaluation and Treatment: Rehabilitation  ONSET DATE: chronic  SUBJECTIVE:   SUBJECTIVE STATEMENT: Mild L hip pain overall.  3/10 discomfort with lift from floor level.  Feels good enough to DC PT today  EVAL: Describes L hip pain over 2 month period.  Symptoms began following a bending over episode where she felt a distinct "pop".  PERTINENT HISTORY: Discussed referral for physical therapy. If the patient does not respond to PT, we may consider imaging and/or evaluation by an orthopedist.  PAIN:  Are you having pain? Yes: NPRS scale: 5-6/10 Pain location: L hip Pain description: ache Aggravating factors: walking,  Relieving factors: undetermined  PRECAUTIONS: None  RED FLAGS: None   WEIGHT BEARING RESTRICTIONS: No  FALLS:  Has patient fallen in last 6 months? No  OCCUPATION: administrative work  PLOF: Independent  PATIENT GOALS: To manage my hip problems  NEXT MD VISIT: TBD  OBJECTIVE:  Note: Objective measures were completed at Evaluation unless otherwise noted.  DIAGNOSTIC FINDINGS: none  PATIENT SURVEYS:  FOTO 48(61 predicted) 11/05/23 59%   MUSCLE LENGTH: Hamstrings: Right 90 deg; Left 90 deg Thomas test: negative  POSTURE: No Significant postural limitations  PALPATION: TTP L piriformis   LOWER EXTREMITY ROM:  Active ROM Right eval Left eval  Hip flexion    Hip extension    Hip abduction    Hip adduction    Hip  internal rotation    Hip external rotation    Knee flexion    Knee extension    Ankle dorsiflexion    Ankle plantarflexion    Ankle inversion    Ankle eversion     (Blank rows = not tested)  LOWER EXTREMITY MMT:  MMT Right eval Left eval  Hip flexion    Hip extension    Hip abduction    Hip adduction    Hip internal rotation    Hip external rotation    Knee flexion    Knee extension    Ankle dorsiflexion    Ankle plantarflexion    Ankle inversion    Ankle eversion     (Blank rows = not tested)  LOWER EXTREMITY  SPECIAL TESTS:  Hip special tests: Luisa Hart (FABER) test: negative, Thomas test: negative, Ober's test: negative, Anterior hip impingement test: negative, and Piriformis test: negative  FUNCTIONAL TESTS:  30 seconds chair stand test  11 reps  GAIT: Distance walked: 56ft x2 Assistive device utilized: None Level of assistance: Complete Independence Comments: unremarkable                                                                                                                               TREATMENT DATE:  [facility] Adult PT Treatment:                                                DATE: 11/13/23 Manual Therapy: Skilled palpation to identify taught and irritable bands in L piriformis Trigger Point Dry Needling  Subsequent Treatment: Instructions provided previously at initial dry needling treatment.   Patient Verbal Consent Given: Yes Education Handout Provided: Yes Muscles Treated: L piriformis Electrical Stimulation Performed: No Treatment Response/Outcome: Decreased pain and tension Self Care: Review of HEP verbally, assessment of L hip ROM flexibility and demonstration of proper body mechanics and lifting techniques to retreive groceries from floor level.  Recommended neutral foot positions as patient tends to toe out.   [facility] Adult PT Treatment:                                                DATE: 11/05/23 Therapeutic Exercise: Nustep L4 8 min L clams GTB 15x2 Bridge GTB 15x2 Manual Therapy: Skilled palpation to identify taught and irritable bands in L piriformis Trigger Point Dry Needling  Subsequent Treatment: Instructions provided previously at initial dry needling treatment.   Patient Verbal Consent Given: Yes Education Handout Provided: Previously Provided Muscles Treated: L piriformis Electrical Stimulation Performed: No Treatment Response/Outcome: pain/point tenderness reduced      [facility] Adult PT Treatment:  DATE:  11/01/23 Therapeutic Exercise: Nustep L4 8 min L clams 15x Bridge 15x L piriformis stretch 30s x2 Manual Therapy: Trigger Point Dry Needling  Initial Treatment: Pt instructed on Dry Needling rational, procedures, and possible side effects. Pt instructed to expect mild to moderate muscle soreness later in the day and/or into the next day.  Pt instructed in methods to reduce muscle soreness. Pt instructed to continue prescribed HEP. Patient was educated on signs and symptoms of infection and other risk factors and advised to seek medical attention should they occur.  Patient verbalized understanding of these instructions and education.   Patient Verbal Consent Given: Yes Education Handout Provided: Yes Muscles Treated: L piriformis Electrical Stimulation Performed: No Treatment Response/Outcome: increased twitch response    [facility] Adult PT Treatment:                                                DATE: 10/29/2023  Therapeutic Exercise: Nustep L4 8 min Supine hip fallouts GTB 15x B, 15/15 unilaterally Bridge against GTB 2 x 10  S/L clams GTB 15x B PPT with alternating marching 10/10 x 2  90/90 30s x2 Bird dog 10/10  [facility] Adult PT Treatment:                                                DATE: 10/25/23 Therapeutic Exercise: Nustep L4 8 min Supine hip fallouts GTB 15x B, 15/15 unilaterally Bridge against GTB 15x S/L clams GTB 15x B Bridge with ball 15x PPT 3s 10x PPT with alternating marching 10/10 90/90 30s x2 Prone on elbows 2 min Bird dog 10/10  [facility] Adult PT Treatment:                                                DATE: 10/23/23 Therapeutic Exercise: Nustep level 5 x 5 mins Seated figure 4 piriformis stretch 2x30" BIL STS with 10# KB 2x10 Sidelying clamshell RTB 2x10 BIL Supine figure 4 piriformis stretch push/pull BIL 2x30" ea BIL Prone quad stretch 2x30" BIL   10/07/23 Eval and HEP    PATIENT EDUCATION:  Education details: Discussed eval findings, rehab  rationale and POC and patient is in agreement  Person educated: Patient Education method: Explanation Education comprehension: verbalized understanding and needs further education  HOME EXERCISE PROGRAM: Access Code: [email or web address] URL: [email or web address] Date: 10/07/2023 Prepared by: Gustavus Bryant  Exercises - Clamshell  - 1 x daily - 5 x weekly - 2 sets - 15 reps - Supine Figure 4 Piriformis Stretch  - 1 x daily - 5 x weekly - 1 sets - 2 reps - 30s hold  ASSESSMENT:  CLINICAL IMPRESSION: Rehab goals met and patient confident to DC to independent management and pilates HEP  Today's session had somewhat decreased volume of strengthening exercises per patient tolerance. Additionally she spent increased time on recumbent stepper at start of session for analgesic effects. She may benefit from elliptical for warm up at next visit.  She reports pain at end of session, but states that it is no worse than beginning of the session.    EVAL:  Patient is a 48 y.o. female who was seen today for physical therapy evaluation and treatment for L hip pain. Patient presents with full and painless hip ROM, no flexibility deficits, good strength throughout but TTP at L piriformis and gluteus medius/TFL regions, suggesting TP pathology or underlying inflammation/bursits.   OBJECTIVE IMPAIRMENTS: decreased activity tolerance, decreased knowledge of condition, increased muscle spasms, and pain.   ACTIVITY LIMITATIONS: carrying, lifting, bending, and walking  PERSONAL FACTORS: Age, Fitness, and Past/current experiences are also affecting patient's functional outcome.   REHAB POTENTIAL: Good  CLINICAL DECISION MAKING: Stable/uncomplicated  EVALUATION COMPLEXITY: Low   GOALS: Goals reviewed with patient? No  SHORT TERM GOALS: Target date: 10/28/2023   Patient to demonstrate independence in HEP  Baseline: [id number] Goal status: Met    LONG TERM GOALS: Target date: 11/18/2023     Patient will acknowledge 2/10 pain at least once during episode of care   Baseline: 5-6/10; 11/13/23 3/10 worst, 0/10 best Goal status: Met  2.  Patient will increase 30s chair stand reps from 11 to 14 without arms to demonstrate and improved functional ability with less pain/difficulty as well as reduce fall risk.  Baseline: 11; 11/13/23 13 Goal status: Met  3.  Patient will score at least 61% on FOTO to signify clinically meaningful improvement in functional abilities.   Baseline: 46; 11/13/23 59% (85% perceived function) Goal status: Met  4.  Decrease TTP L piriformis to minimal Baseline: Moderate TTP; 11/13/23 minimal Goal status: Met   PLAN:  PT FREQUENCY: 1-2x/week  PT DURATION: 6 weeks  PLANNED INTERVENTIONS: 97164- PT Re-evaluation, 97110-Therapeutic exercises, 97530- Therapeutic activity, 97112- Neuromuscular re-education, 97535- Self Care, [id number]- Manual therapy, [id number]- Gait training, [id number]- Aquatic Therapy, Dry Needling, Joint mobilization, Spinal mobilization, Cryotherapy, and Moist heat  PLAN FOR NEXT SESSION: HEP review and update, manual techniques as appropriate, aerobic tasks, ROM and flexibility activities, strengthening and PREs, TPDN, gait and balance training as needed     Learta Codding PT  11/13/2023 12:04 PM

## 2023-11-13 ENCOUNTER — Ambulatory Visit: Payer: 59

## 2023-11-13 DIAGNOSIS — M25552 Pain in left hip: Secondary | ICD-10-CM | POA: Diagnosis not present

## 2023-11-13 DIAGNOSIS — M6281 Muscle weakness (generalized): Secondary | ICD-10-CM

## 2023-12-02 ENCOUNTER — Encounter: Payer: Self-pay | Admitting: Oncology

## 2023-12-02 ENCOUNTER — Inpatient Hospital Stay (HOSPITAL_BASED_OUTPATIENT_CLINIC_OR_DEPARTMENT_OTHER): Payer: 59 | Admitting: Oncology

## 2023-12-02 ENCOUNTER — Inpatient Hospital Stay: Payer: 59

## 2023-12-02 ENCOUNTER — Inpatient Hospital Stay: Payer: 59 | Attending: Oncology

## 2023-12-02 VITALS — BP 158/124 | HR 98 | Temp 98.2°F | Resp 19 | Wt 203.2 lb

## 2023-12-02 DIAGNOSIS — R5383 Other fatigue: Secondary | ICD-10-CM | POA: Insufficient documentation

## 2023-12-02 DIAGNOSIS — E538 Deficiency of other specified B group vitamins: Secondary | ICD-10-CM

## 2023-12-02 DIAGNOSIS — Z833 Family history of diabetes mellitus: Secondary | ICD-10-CM | POA: Diagnosis not present

## 2023-12-02 DIAGNOSIS — D519 Vitamin B12 deficiency anemia, unspecified: Secondary | ICD-10-CM | POA: Diagnosis present

## 2023-12-02 DIAGNOSIS — D508 Other iron deficiency anemias: Secondary | ICD-10-CM

## 2023-12-02 DIAGNOSIS — R059 Cough, unspecified: Secondary | ICD-10-CM | POA: Diagnosis not present

## 2023-12-02 DIAGNOSIS — D509 Iron deficiency anemia, unspecified: Secondary | ICD-10-CM

## 2023-12-02 DIAGNOSIS — Z8249 Family history of ischemic heart disease and other diseases of the circulatory system: Secondary | ICD-10-CM | POA: Diagnosis not present

## 2023-12-02 DIAGNOSIS — Z79899 Other long term (current) drug therapy: Secondary | ICD-10-CM | POA: Insufficient documentation

## 2023-12-02 DIAGNOSIS — Z9049 Acquired absence of other specified parts of digestive tract: Secondary | ICD-10-CM | POA: Diagnosis not present

## 2023-12-02 LAB — IRON AND TIBC
Iron: 55 ug/dL (ref 28–170)
Saturation Ratios: 17 % (ref 10.4–31.8)
TIBC: 333 ug/dL (ref 250–450)
UIBC: 278 ug/dL

## 2023-12-02 LAB — CBC
HCT: 43 % (ref 36.0–46.0)
Hemoglobin: 14 g/dL (ref 12.0–15.0)
MCH: 29.2 pg (ref 26.0–34.0)
MCHC: 32.6 g/dL (ref 30.0–36.0)
MCV: 89.6 fL (ref 80.0–100.0)
Platelets: 357 10*3/uL (ref 150–400)
RBC: 4.8 MIL/uL (ref 3.87–5.11)
RDW: 14.1 % (ref 11.5–15.5)
WBC: 12.6 10*3/uL — ABNORMAL HIGH (ref 4.0–10.5)
nRBC: 0 % (ref 0.0–0.2)

## 2023-12-02 LAB — FERRITIN: Ferritin: 93 ng/mL (ref 11–307)

## 2023-12-02 MED ORDER — CYANOCOBALAMIN 1000 MCG/ML IJ SOLN
1000.0000 ug | Freq: Once | INTRAMUSCULAR | Status: AC
  Administered 2023-12-02: 1000 ug via INTRAMUSCULAR
  Filled 2023-12-02: qty 1

## 2023-12-02 MED ORDER — SYRINGE/NEEDLE (DISP) 25G X 1" 3 ML MISC: 1.0000 mL | 0 refills | Status: DC

## 2023-12-02 NOTE — Progress Notes (Signed)
Hematology/Oncology Consult note [facility]  [phone] [phone]  Patient Care Team: Deatra James, MD as PCP - General (Family Medicine) Creig Hines, MD as Consulting Physician (Oncology)   Name of the patient: Gloria Mcpherson  [id number]  01/21/1976   Date of visit: 12/02/23  Diagnosis-iron deficiency anemia  Chief complaint/ Reason for visit-routine follow-up of iron deficiency anemia  Heme/Onc history:  Patient is a 48 year old female with history of iron and B12 deficiency anemia.  She has received Venofer in the past.  Because of iron deficiency has been attributed to heavy menses in the past.  EGD was about 2 years ago and colonoscopy was several years ago.  History of lupus and Sjogren's. Patient was seen again by GI at [facility] and underwent EGD and colonoscopy which did not show any evidence of bleeding or malignancy.  Capsule endoscopy was not done at that time.  Iron deficiency has been attributed to poor absorption.      Interval history-patient had COVID back in August 2024 and since then she has been having on and off episodes of upper respiratory tract infections.  She has gone through antibiotics and chest x-ray as well in the past and symptoms have been waxing and waning but have not gone away.  ECOG PS- 0 Pain scale- 0   Review of systems- Review of Systems  Constitutional:  Positive for malaise/fatigue. Negative for chills, fever and weight loss.  HENT:  Positive for congestion. Negative for ear discharge and nosebleeds.   Eyes:  Negative for blurred vision.  Respiratory:  Positive for cough. Negative for hemoptysis, sputum production, shortness of breath and wheezing.   Cardiovascular:  Negative for chest pain, palpitations, orthopnea and claudication.  Gastrointestinal:  Negative for abdominal pain, blood in stool, constipation, diarrhea, heartburn, melena, nausea and vomiting.   Genitourinary:  Negative for dysuria, flank pain, frequency, hematuria and urgency.  Musculoskeletal:  Negative for back pain, joint pain and myalgias.  Skin:  Negative for rash.  Neurological:  Negative for dizziness, tingling, focal weakness, seizures, weakness and headaches.  Endo/Heme/Allergies:  Does not bruise/bleed easily.  Psychiatric/Behavioral:  Negative for depression and suicidal ideas. The patient does not have insomnia.       Allergies  Allergen Reactions   Cat Dander Itching     Past Medical History:  Diagnosis Date   Anemia    Anxiety    Hypertension    Lupus    Lupus 2009   Osteoporosis      Past Surgical History:  Procedure Laterality Date   BACK SURGERY     2019 had a laminectomy   CESAREAN SECTION  07/2014   CHOLECYSTECTOMY     CHOLECYSTECTOMY  05/2020    Social History   Socioeconomic History   Marital status: Married    Spouse name: Not on file   Number of children: Not on file   Years of education: Not on file   Highest education level: Not on file  Occupational History   Not on file  Tobacco Use   Smoking status: Never   Smokeless tobacco: Never  Vaping Use   Vaping status: Never Used  Substance and Sexual Activity   Alcohol use: Yes    Alcohol/week: 1.0 standard drink of alcohol    Types: 1 Glasses of wine per week   Drug use: Never   Sexual activity: Yes  Other Topics Concern   Not on file  Social History Narrative  Not on file   Social Drivers of Health   Financial Resource Strain: Not on file  Food Insecurity: Low Risk  (04/23/2023)   Received from [facility]   Hunger Vital Sign    Worried About Running Out of Food in the Last Year: Never true    Ran Out of Food in the Last Year: Never true  Transportation Needs: Not on file (04/23/2023)  Physical Activity: Not on file  Stress: Not on file  Social Connections: Not on file  Intimate Partner Violence: Not on file    Family History  Problem Relation Age of Onset    Hypertension Mother    Hypertension Paternal Uncle    Diabetes Maternal Grandmother    Breast cancer Neg Hx      Current Outpatient Medications:    acetaminophen (TYLENOL) 500 MG tablet, Take 500 mg by mouth every 6 (six) hours as needed., Disp: , Rfl:    alendronate (FOSAMAX) 70 MG tablet, Take 70 mg by mouth., Disp: , Rfl:    amitriptyline (ELAVIL) 100 MG tablet, Take 100 mg by mouth at bedtime., Disp: , Rfl:    amLODipine-valsartan (EXFORGE) 5-320 MG tablet, Take 1 tablet by mouth daily., Disp: , Rfl:    amLODIPine-Valsartan-HCTZ 10-160-12.5 MG TABS, , Disp: , Rfl:    amoxicillin-clavulanate (AUGMENTIN) 875-125 MG tablet, Take 1 tablet by mouth every 12 (twelve) hours., Disp: 14 tablet, Rfl: 0   chlorpheniramine-HYDROcodone (TUSSIONEX) 10-8 MG/5ML, Take 5 mLs by mouth every 12 (twelve) hours as needed., Disp: 115 mL, Rfl: 0   dicyclomine (BENTYL) 10 MG capsule, Take 10 mg by mouth every 6 (six) hours as needed., Disp: , Rfl:    Digestive Enzymes TABS, Take by mouth daily., Disp: , Rfl:    dronabinol (MARINOL) 5 MG capsule, Take by mouth. Patient takes twice a day, Disp: , Rfl:    escitalopram (LEXAPRO) 10 MG tablet, Take 15 mg by mouth daily. 1 and 1/2 tabs daily, Disp: , Rfl:    famotidine (PEPCID) 40 MG tablet, Take by mouth., Disp: , Rfl:    hydrochlorothiazide (MICROZIDE) 12.5 MG capsule, Take 12.5 mg by mouth daily., Disp: , Rfl:    HYDROcodone-acetaminophen (NORCO/VICODIN) 5-325 MG tablet, Take 1 tablet by mouth every 6 (six) hours as needed., Disp: , Rfl:    hydroxychloroquine (PLAQUENIL) 200 MG tablet, Take 200 mg by mouth daily., Disp: , Rfl:    LORazepam (ATIVAN) 1 MG tablet, Take 1 mg by mouth as needed., Disp: , Rfl:    naproxen sodium (ALEVE) 220 MG tablet, Take by mouth., Disp: , Rfl:    NON FORMULARY, Take by mouth 3 (three) times daily with meals., Disp: , Rfl:    predniSONE (DELTASONE) 10 MG tablet, Take 5 mg by mouth at bedtime as needed (take 5-20mg , as needed).,  Disp: , Rfl:    prochlorperazine (COMPAZINE) 5 MG tablet, , Disp: , Rfl:    RABEprazole (ACIPHEX) 20 MG tablet, Take 20 mg by mouth daily., Disp: , Rfl:    Syringe/Needle, Disp, (SYRINGE 3CC/25GX1") 25G X 1" 3 ML MISC, 1 mL by Does not apply route every 30 (thirty) days., Disp: 5 each, Rfl: 0  Physical exam: There were no vitals filed for this visit. Physical Exam Cardiovascular:     Rate and Rhythm: Normal rate and regular rhythm.     Heart sounds: Normal heart sounds.  Pulmonary:     Effort: Pulmonary effort is normal.     Breath sounds: Normal breath sounds.  Skin:  General: Skin is warm and dry.  Neurological:     Mental Status: She is alert and oriented to person, place, and time.         Latest Ref Rng & Units 02/02/2022    1:54 PM  CMP  Glucose 70 - 99 mg/dL 82   BUN 6 - 20 mg/dL 15   Creatinine 3.08 - 1.00 mg/dL 6.57   Sodium 846 - 962 mmol/L 133   Potassium 3.5 - 5.1 mmol/L 3.6   Chloride 98 - 111 mmol/L 100   CO2 22 - 32 mmol/L 27   Calcium 8.9 - 10.3 mg/dL 8.6   Total Protein 6.5 - 8.1 g/dL 7.0   Total Bilirubin 0.3 - 1.2 mg/dL 0.2   Alkaline Phos 38 - 126 U/L 66   AST 15 - 41 U/L 19   ALT 0 - 44 U/L 19       Latest Ref Rng & Units 08/12/2023    3:06 PM  CBC  WBC 4.0 - 10.5 K/uL 9.6   Hemoglobin 12.0 - 15.0 g/dL 95.2   Hematocrit 84.1 - 46.0 % 39.5   Platelets 150 - 400 K/uL 309      Assessment and plan- Patient is a 48 y.o. female here for routine follow-up of iron deficiency anemia  Patient last received IV iron in November 2022.  Patient is not presently anemic. Iron studies are not indicative of iron deficiency given the ferritin levels of 93 and normal iron studies.  She does not require any IV iron at this time.  CBC ferritin and iron studies in 6 months in 1 year and I will see her back in 1 year  She has been on B12 injection in November 2024 as well as January 2025.  She will receive her next dose of B12 injection today and we will send her  B12 syringes and injections so that she can take it at home on a monthly basis.  Levels to be rechecked in 6 months in 1 year   Visit Diagnosis 1. Iron deficiency anemia, unspecified iron deficiency anemia type      Dr. Owens Shark, MD, MPH [facility] at [facility] [phone] 12/02/2023 1:39 PM

## 2024-02-14 ENCOUNTER — Other Ambulatory Visit: Payer: Self-pay | Admitting: Family Medicine

## 2024-02-14 DIAGNOSIS — Z1231 Encounter for screening mammogram for malignant neoplasm of breast: Secondary | ICD-10-CM

## 2024-02-18 ENCOUNTER — Ambulatory Visit
Admission: RE | Admit: 2024-02-18 | Discharge: 2024-02-18 | Discharge status: Home / Self Care | Source: Ambulatory Visit | Attending: Family Medicine | Admitting: Family Medicine

## 2024-02-18 DIAGNOSIS — Z1231 Encounter for screening mammogram for malignant neoplasm of breast: Secondary | ICD-10-CM

## 2024-05-01 ENCOUNTER — Encounter: Payer: Self-pay | Admitting: Advanced Practice Midwife

## 2024-05-29 ENCOUNTER — Other Ambulatory Visit: Payer: Self-pay

## 2024-05-29 DIAGNOSIS — D508 Other iron deficiency anemias: Secondary | ICD-10-CM

## 2024-05-29 DIAGNOSIS — E538 Deficiency of other specified B group vitamins: Secondary | ICD-10-CM

## 2024-06-01 ENCOUNTER — Inpatient Hospital Stay: Payer: 59 | Attending: Oncology

## 2024-06-01 DIAGNOSIS — E538 Deficiency of other specified B group vitamins: Secondary | ICD-10-CM | POA: Insufficient documentation

## 2024-06-01 DIAGNOSIS — D509 Iron deficiency anemia, unspecified: Secondary | ICD-10-CM | POA: Diagnosis present

## 2024-06-01 DIAGNOSIS — D508 Other iron deficiency anemias: Secondary | ICD-10-CM

## 2024-06-01 LAB — CBC (CANCER CENTER ONLY)
HCT: 39.7 % (ref 36.0–46.0)
Hemoglobin: 12.8 g/dL (ref 12.0–15.0)
MCH: 29.1 pg (ref 26.0–34.0)
MCHC: 32.2 g/dL (ref 30.0–36.0)
MCV: 90.2 fL (ref 80.0–100.0)
Platelet Count: 334 K/uL (ref 150–400)
RBC: 4.4 MIL/uL (ref 3.87–5.11)
RDW: 13.4 % (ref 11.5–15.5)
WBC Count: 13.3 K/uL — ABNORMAL HIGH (ref 4.0–10.5)
nRBC: 0 % (ref 0.0–0.2)

## 2024-06-01 LAB — FERRITIN: Ferritin: 59 ng/mL (ref 11–307)

## 2024-06-01 LAB — IRON AND TIBC
Iron: 46 ug/dL (ref 28–170)
Saturation Ratios: 13 % (ref 10.4–31.8)
TIBC: 347 ug/dL (ref 250–450)
UIBC: 301 ug/dL

## 2024-06-01 LAB — VITAMIN B12: Vitamin B-12: 343 pg/mL (ref 180–914)

## 2024-09-13 ENCOUNTER — Emergency Department

## 2024-09-13 ENCOUNTER — Emergency Department: Admission: EM | Admit: 2024-09-13 | Discharge: 2024-09-13 | Disposition: A | Discharge status: Home / Self Care

## 2024-09-13 ENCOUNTER — Other Ambulatory Visit: Payer: Self-pay

## 2024-09-13 DIAGNOSIS — D72829 Elevated white blood cell count, unspecified: Secondary | ICD-10-CM | POA: Diagnosis not present

## 2024-09-13 DIAGNOSIS — R519 Headache, unspecified: Secondary | ICD-10-CM | POA: Diagnosis present

## 2024-09-13 DIAGNOSIS — I10 Essential (primary) hypertension: Secondary | ICD-10-CM | POA: Diagnosis not present

## 2024-09-13 LAB — CBC WITH DIFFERENTIAL/PLATELET
Abs Immature Granulocytes: 0.07 K/uL (ref 0.00–0.07)
Basophils Absolute: 0.1 K/uL (ref 0.0–0.1)
Basophils Relative: 1 %
Eosinophils Absolute: 0.1 K/uL (ref 0.0–0.5)
Eosinophils Relative: 1 %
HCT: 42.2 % (ref 36.0–46.0)
Hemoglobin: 13.6 g/dL (ref 12.0–15.0)
Immature Granulocytes: 1 %
Lymphocytes Relative: 17 %
Lymphs Abs: 2.1 K/uL (ref 0.7–4.0)
MCH: 28.2 pg (ref 26.0–34.0)
MCHC: 32.2 g/dL (ref 30.0–36.0)
MCV: 87.4 fL (ref 80.0–100.0)
Monocytes Absolute: 1 K/uL (ref 0.1–1.0)
Monocytes Relative: 8 %
Neutro Abs: 9 K/uL — ABNORMAL HIGH (ref 1.7–7.7)
Neutrophils Relative %: 72 %
Platelets: 364 K/uL (ref 150–400)
RBC: 4.83 MIL/uL (ref 3.87–5.11)
RDW: 13.6 % (ref 11.5–15.5)
WBC: 12.4 K/uL — ABNORMAL HIGH (ref 4.0–10.5)
nRBC: 0 % (ref 0.0–0.2)

## 2024-09-13 LAB — BASIC METABOLIC PANEL WITH GFR
Anion gap: 12 (ref 5–15)
BUN: 6 mg/dL (ref 6–20)
CO2: 28 mmol/L (ref 22–32)
Calcium: 9.3 mg/dL (ref 8.9–10.3)
Chloride: 101 mmol/L (ref 98–111)
Creatinine, Ser: 0.72 mg/dL (ref 0.44–1.00)
GFR, Estimated: >60 mL/min (ref >60)
Glucose, Bld: 86 mg/dL (ref 70–99)
Potassium: 3.7 mmol/L (ref 3.5–5.1)
Sodium: 140 mmol/L (ref 135–145)

## 2024-09-13 LAB — HCG, QUANTITATIVE, PREGNANCY: hCG, Beta Chain, Quant, S: 1 m[IU]/mL (ref <5)

## 2024-09-13 MED ORDER — DIPHENHYDRAMINE HCL 50 MG/ML IJ SOLN
25.0000 mg | Freq: Once | INTRAMUSCULAR | Status: AC
  Administered 2024-09-13: 25 mg via INTRAVENOUS
  Filled 2024-09-13: qty 1

## 2024-09-13 MED ORDER — METOCLOPRAMIDE HCL 5 MG/ML IJ SOLN
10.0000 mg | Freq: Once | INTRAMUSCULAR | Status: AC
  Administered 2024-09-13: 10 mg via INTRAVENOUS
  Filled 2024-09-13: qty 2

## 2024-09-13 MED ORDER — KETOROLAC TROMETHAMINE 15 MG/ML IJ SOLN
15.0000 mg | Freq: Once | INTRAMUSCULAR | Status: AC
  Administered 2024-09-13: 15 mg via INTRAVENOUS
  Filled 2024-09-13: qty 1

## 2024-09-13 MED ORDER — ACETAMINOPHEN 500 MG PO TABS
1000.0000 mg | ORAL_TABLET | Freq: Once | ORAL | Status: AC
  Administered 2024-09-13: 1000 mg via ORAL
  Filled 2024-09-13: qty 2

## 2024-09-13 MED ORDER — SODIUM CHLORIDE 0.9 % IV BOLUS
1000.0000 mL | Freq: Once | INTRAVENOUS | Status: AC
  Administered 2024-09-13: 1000 mL via INTRAVENOUS

## 2024-09-13 NOTE — ED Triage Notes (Addendum)
 Pt comes with headache and nausea for 4 days. Pt states hx of this back in her 52s.  Pt does have hx of HTN but didn't take meds for couple days until yesterday.

## 2024-09-13 NOTE — ED Provider Notes (Signed)
 [facility] Provider Note    Event Date/Time   First MD Initiated Contact with Patient 09/13/24 1653     (approximate)   History   Headache  Pt comes with headache and nausea for 4 days. Pt states hx of this back in her 43s.  Pt does have hx of HTN but didn't take meds for couple days until yesterday.    HPI Gloria Mcpherson is a 48 y.o. female PMH SLE and Sjogren syndrome on chronic prednisone , anemia, hypertension, gastroparesis, anxiety presents for evaluation of headache - States it feels like a typical migraine for her and that she has not had one this severe in decades - Present for about 4 days.  Gradual onset.  Persistent.  No preceding trauma.  Not on thinners.  No significant neck pain or stiffness.  No fevers.  No vision change. - Refractory to Tylenol and caffeine as well as hydrocodone - Denies any focal weakness - Some nausea though this is chronic for her.  Has had decreased p.o. intake over the past few days.       Physical Exam   Triage Vital Signs: ED Triage Vitals [09/13/24 1551]  Encounter Vitals Group     BP (!) 159/103     Girls Systolic BP Percentile      Girls Diastolic BP Percentile      Boys Systolic BP Percentile      Boys Diastolic BP Percentile      Pulse Rate (!) 119     Resp 18     Temp 98.3 F (36.8 C)     Temp src      SpO2 100 %     Weight 206 lb (93.4 kg)     Height 5' 3 (1.6 m)     Head Circumference      Peak Flow      Pain Score      Pain Loc      Pain Education      Exclude from Growth Chart     Most recent vital signs: Vitals:   09/13/24 1551 09/13/24 1928  BP: (!) 159/103 (!) 140/81  Pulse: (!) 119 94  Resp: 18 18  Temp: 98.3 F (36.8 C) 98.3 F (36.8 C)  SpO2: 100% 94%     General: Awake, no distress.  CV:  Good peripheral perfusion.  Tachycardic, regular rhythm, RP 2+ Resp:  Normal effort.  Abd:  No distention.  Neuro:  Aox4, CN II-XII intact, FNF wnl, finger taps  fast b/l, 5/5 strength in bilateral finger extension/grip, arm flexion/extension, EHL/FHL. BUE AG 10+ sec no drift, BLE AG 5+ sec no drift. Ambulates with steady gait. SILT. Negative Rhomberg. + Tremor right upper extremity though patient states this is chronic.    ED Results / Procedures / Treatments   Labs (all labs ordered are listed, but only abnormal results are displayed) Labs Reviewed  CBC WITH DIFFERENTIAL/PLATELET - Abnormal; Notable for the following components:      Result Value   WBC 12.4 (*)    Neutro Abs 9.0 (*)    All other components within normal limits  BASIC METABOLIC PANEL WITH GFR  HCG, QUANTITATIVE, PREGNANCY     EKG  See ED course below   RADIOLOGY Etiology interpreted by myself radiology report reviewed.  No acute pathology identified.    PROCEDURES:  Critical Care performed: No  Procedures   MEDICATIONS ORDERED IN ED: Medications  ketorolac (TORADOL) 15 MG/ML injection 15 mg (  15 mg Intravenous Given 09/13/24 1744)  metoCLOPramide (REGLAN) injection 10 mg (10 mg Intravenous Given 09/13/24 1746)  diphenhydrAMINE (BENADRYL) injection 25 mg (25 mg Intravenous Given 09/13/24 1743)  sodium chloride  0.9 % bolus 1,000 mL (1,000 mLs Intravenous New Bag/Given 09/13/24 1751)  acetaminophen (TYLENOL) tablet 1,000 mg (1,000 mg Oral Given 09/13/24 1747)  ketorolac (TORADOL) 15 MG/ML injection 15 mg (15 mg Intravenous Given 09/13/24 1927)     IMPRESSION / MDM / ASSESSMENT AND PLAN / ED COURSE  I reviewed the triage vital signs and the nursing notes.                              DDX/MDM/AP: Differential diagnosis includes, but is not limited to, migraine or other benign headache type, doubt subarachnoid hemorrhage given clinical history, doubt intracranial mass, do not clinically suspect meningitis.  Suspect some degree of dehydration contributing, suspect tachycardia likely secondary to this and pain, will get screening EKG to ensure no underlying  arrhythmia.  Somewhat hypertensive though not markedly so, do not suspect this is driving headache.  Plan: - Screening CT head from triage --> unremarkable - Basic labs - Tylenol, Toradol, Reglan, Benadryl, IV fluid - ecg - reassess  Patient's presentation is most consistent with acute presentation with potential threat to life or bodily function.   ED course below.   Clinical Course as of 09/13/24 1933  Sun Sep 13, 2024  1656 CTH: IMPRESSION: Normal head CT.   [MM]  1847 Hcg neg [MM]  1847 CBC with mild leukocytosis similar to prior, suspect secondary to chronic steroids  BMP reviewed, unremarkable [MM]  1915 Ecg = sinus rhythm, heart rate 92, no gross ST elevation or depression, no significant repolarization abnormality, left axis deviation, normal intervals.  No evidence of ischemia nor arrhythmia on my interpretation. [MM]  1916 Patient reevaluated, notes she is feeling much better, headache currently 5/10.  Reassured by overall unremarkable workup today and amenable to discharge home but would like 1 more dose of pain medication if possible, will repeat Toradol that she will receive 15 mg previously and has normal renal function.  Counseled on alarm titration.  Plan to continue Tylenol and Motrin at home as needed for any ongoing discomfort.  Is already on Compazine for her gastroparesis, told her she can use this in addition.  Plan for PMD follow-up.  ED return precautions in place.  Patient agrees with plan.  Evidence of acute pathology at this time. [MM]    Clinical Course User Index [MM] Clarine Ozell LABOR, MD     FINAL CLINICAL IMPRESSION(S) / ED DIAGNOSES   Final diagnoses:  Acute nonintractable headache, unspecified headache type     Rx / DC Orders   ED Discharge Orders     None        Note:  This document was prepared using Dragon voice recognition software and may include unintentional dictation errors.   Clarine Ozell LABOR, MD 09/13/24 [phone]

## 2024-09-13 NOTE — Discharge Instructions (Signed)
 Your evaluation in the emergency department was overall reassuring, your symptoms improved with treatment.  I suspect you had a migraine headache today, and I recommend using Tylenol and Motrin as needed for any ongoing discomfort.  Maintaining good hydration can help prevent headaches, and the Compazine medication you use for your gastroparesis can also help with this as well.  Please do follow-up with your primary care provider for reevaluation, and return to the emergency department with any new or worsening symptoms.

## 2024-09-17 ENCOUNTER — Encounter: Payer: Self-pay | Admitting: Hematology and Oncology

## 2024-09-24 ENCOUNTER — Encounter: Payer: Self-pay | Admitting: Diagnostic Neuroimaging

## 2024-09-24 ENCOUNTER — Ambulatory Visit: Admitting: Diagnostic Neuroimaging

## 2024-09-24 ENCOUNTER — Ambulatory Visit: Admitting: Neurology

## 2024-09-24 VITALS — BP 127/85 | HR 103 | Ht 63.0 in | Wt 200.0 lb

## 2024-09-24 DIAGNOSIS — G43101 Migraine with aura, not intractable, with status migrainosus: Secondary | ICD-10-CM

## 2024-09-24 DIAGNOSIS — R519 Headache, unspecified: Secondary | ICD-10-CM

## 2024-09-24 DIAGNOSIS — R42 Dizziness and giddiness: Secondary | ICD-10-CM

## 2024-09-24 DIAGNOSIS — R269 Unspecified abnormalities of gait and mobility: Secondary | ICD-10-CM | POA: Diagnosis not present

## 2024-09-24 MED ORDER — RIZATRIPTAN BENZOATE 10 MG PO TBDP: 10.0000 mg | ORAL_TABLET | ORAL | 11 refills | Status: DC | PRN

## 2024-09-24 MED ORDER — TOPIRAMATE 50 MG PO TABS: 50.0000 mg | ORAL_TABLET | Freq: Two times a day (BID) | ORAL | 12 refills | Status: DC

## 2024-09-24 MED ORDER — PREDNISONE 10 MG PO TABS: ORAL_TABLET | ORAL | 0 refills | Status: AC

## 2024-09-24 NOTE — Progress Notes (Signed)
 [facility]  PATIENT: Gloria Mcpherson DOB: 1976/02/23  REFERRING CLINICIAN: Arloa Elsie SAUNDERS, MD HISTORY FROM: patient  REASON FOR VISIT: new consult   HISTORICAL  CHIEF COMPLAINT:  Chief Complaint  Patient presents with   RM 6     Patient is here alone for migraines - currently has a migraine - usually has pain, dizziness, difficulty focusing/ processing things, and has to concentrate on walking, and nausea     HISTORY OF PRESENT ILLNESS:   48 year old female here for evaluation of worsening headaches. History of lupus, Sjogren's and migraine headaches.  Patient had onset of migraine headaches since teenage years.  She describes a nice twisting sensation in the head associate with nausea, dizziness, brain fog, waves of blurred vision and sensitivity to light.  She was tried on Effexor and Imitrex tablets and shots when she was younger with mild relief.  By her 43s she had her last major migraine headache and then was headache free for many years.  09/11/2024 patient had return of severe headaches this time with pain in the back of the head, right temporal pain, nausea.  Headaches have been consistent and not a daily basis since that time.  She has tried sumatriptan without relief.  Has been prescribed Nurtec every other day for migraine prevention.  She went to the emergency room on 09/13/2024 due to severe worsening headaches.  Had CT scan of the head which was unremarkable.  No prodromal accidents injuries or traumas.  No aggravating or alleviating factors.   REVIEW OF SYSTEMS: Full 14 system review of systems performed and negative with exception of: as per HPI.  ALLERGIES: Allergies[1]  HOME MEDICATIONS: Outpatient Medications Prior to Visit  Medication Sig Dispense Refill   acetaminophen  (TYLENOL ) 500 MG tablet Take 500 mg by mouth every 6 (six) hours as needed.     alendronate (FOSAMAX) 70 MG tablet Take 70 mg by mouth.      amitriptyline (ELAVIL) 100 MG tablet Take 100 mg by mouth at bedtime.     amLODipine-valsartan (EXFORGE) 5-320 MG tablet Take 1 tablet by mouth daily.     dicyclomine (BENTYL) 10 MG capsule Take 10 mg by mouth every 6 (six) hours as needed.     Digestive Enzymes TABS Take by mouth daily.     escitalopram (LEXAPRO) 10 MG tablet Take 15 mg by mouth daily. 1 and 1/2 tabs daily     famotidine (PEPCID) 40 MG tablet Take by mouth.     hydrochlorothiazide (MICROZIDE) 12.5 MG capsule Take 12.5 mg by mouth daily.     HYDROcodone-acetaminophen  (NORCO/VICODIN) 5-325 MG tablet Take 1 tablet by mouth every 6 (six) hours as needed.     hydroxychloroquine (PLAQUENIL) 200 MG tablet Take 200 mg by mouth daily.     LORazepam (ATIVAN) 1 MG tablet Take 1 mg by mouth as needed.     NON FORMULARY Take by mouth 3 (three) times daily with meals.     predniSONE  (DELTASONE ) 10 MG tablet Take 5 mg by mouth at bedtime as needed (take 5-20mg , as needed).     prochlorperazine (COMPAZINE) 5 MG tablet      RABEprazole (ACIPHEX) 20 MG tablet Take 20 mg by mouth daily.     amLODIPine-Valsartan-HCTZ 10-160-12.5 MG TABS      amoxicillin -clavulanate (AUGMENTIN ) 875-125 MG tablet Take 1 tablet by mouth every 12 (twelve) hours. (Patient not taking: Reported on 12/02/2023) 14 tablet 0   chlorpheniramine-HYDROcodone (TUSSIONEX) 10-8 MG/5ML Take 5 mLs by mouth  every 12 (twelve) hours as needed. 115 mL 0   dronabinol (MARINOL) 5 MG capsule Take by mouth. Patient takes twice a day (Patient not taking: Reported on 12/02/2023)     naproxen sodium (ALEVE) 220 MG tablet Take by mouth. (Patient not taking: Reported on 12/02/2023)     SYRINGE-NEEDLE, DISP, 3 ML 25G X 1 3 ML MISC 1 mL by Does not apply route every 30 (thirty) days. 5 each 0   Syringe/Needle, Disp, (SYRINGE 3CC/25GX1) 25G X 1 3 ML MISC 1 mL by Does not apply route every 30 (thirty) days. (Patient not taking: Reported on 12/02/2023) 5 each 0   No facility-administered medications  prior to visit.    PAST MEDICAL HISTORY: Past Medical History:  Diagnosis Date   Anemia    Anxiety    Hypertension    Lupus    Lupus 2009   Osteoporosis     PAST SURGICAL HISTORY: Past Surgical History:  Procedure Laterality Date   BACK SURGERY     2019 had a laminectomy   CESAREAN SECTION  07/2014   CHOLECYSTECTOMY     CHOLECYSTECTOMY  05/2020    FAMILY HISTORY: Family History  Problem Relation Age of Onset   Hypertension Mother    Diabetes Maternal Grandmother    Stroke Maternal Grandfather    Hypertension Paternal Uncle    Stroke Other    Breast cancer Other 80 - 89   Migraines Neg Hx    Seizures Neg Hx    Sleep apnea Neg Hx     SOCIAL HISTORY: Social History   Socioeconomic History   Marital status: Married    Spouse name: Not on file   Number of children: Not on file   Years of education: Not on file   Highest education level: Not on file  Occupational History   Not on file  Tobacco Use   Smoking status: Never   Smokeless tobacco: Never  Vaping Use   Vaping status: Never Used  Substance and Sexual Activity   Alcohol use: Yes    Alcohol/week: 1.0 standard drink of alcohol    Types: 1 Glasses of wine per week   Drug use: Never   Sexual activity: Yes  Other Topics Concern   Not on file  Social History Narrative   1 cup of coffee and 1-2 sodas weekly    Social Drivers of Health   Tobacco Use: Low Risk (09/13/2024)   Patient History    Smoking Tobacco Use: Never    Smokeless Tobacco Use: Never    Passive Exposure: Not on file  Financial Resource Strain: Not on file  Food Insecurity: Low Risk (04/23/2023)   Received from [facility]   Epic    Within the past 12 months, you worried that your food would run out before you got money to buy more: Never true    Within the past 12 months, the food you bought just didn't last and you didn't have money to get more. : Never true  Transportation Needs: No Transportation Needs (04/23/2023)   Received  from [facility]    In the past 12 months, has lack of reliable transportation kept you from medical appointments, meetings, work or from getting things needed for daily living? : No  Physical Activity: Not on file  Stress: Not on file  Social Connections: Not on file  Intimate Partner Violence: Not on file  Depression (EYV7-0): Not on file  Alcohol Screen: Not on file  Housing: Unknown (03/01/2024)   [facility] from [facility]   Epic    Unable to Pay for Housing in the Last Year: Not on file    Number of Times Moved in the Last Year: Not on file    At any time in the past 12 months, were you homeless or living in a shelter (including now)?: No  Utilities: Low Risk (04/23/2023)   Received from [facility]   Utilities    In the past 12 months has the electric, gas, oil, or water company threatened to shut off services in your home? : No  Health Literacy: Not on file     PHYSICAL EXAM  GENERAL EXAM/CONSTITUTIONAL: Vitals:  Vitals:   09/24/24 1418  BP: 127/85  Pulse: (!) 103  Weight: 200 lb (90.7 kg)  Height: 5' 3 (1.6 m)   Body mass index is 35.43 kg/m. Wt Readings from Last 3 Encounters:  09/24/24 200 lb (90.7 kg)  09/13/24 206 lb (93.4 kg)  12/02/23 203 lb 3.2 oz (92.2 kg)   SLIGHT MALAISE APPEARANCE; well developed, nourished and groomed; neck is supple  CARDIOVASCULAR: Examination of carotid arteries is normal; no carotid bruits Regular rate and rhythm, no murmurs Examination of peripheral vascular system by observation and palpation is normal TEMPORAL ARTERIES PALPABLE AND NON TENDER  EYES: Ophthalmoscopic exam of optic discs and posterior segments is normal; no papilledema or hemorrhages No results found.  MUSCULOSKELETAL: Gait, strength, tone, movements noted in Neurologic exam below  NEUROLOGIC: MENTAL STATUS:      No data to display         awake, alert, oriented to person, place and time recent and remote  memory intact normal attention and concentration language fluent, comprehension intact, naming intact fund of knowledge appropriate  CRANIAL NERVE:  2nd - no papilledema on fundoscopic exam 2nd, 3rd, 4th, 6th - pupils equal and reactive to light, visual fields full to confrontation, extraocular muscles intact, no nystagmus 5th - facial sensation symmetric 7th - facial strength symmetric 8th - hearing intact 9th - palate elevates symmetrically, uvula midline 11th - shoulder shrug symmetric 12th - tongue protrusion midline  MOTOR:  normal bulk and tone, full strength in the BUE, BLE  SENSORY:  normal and symmetric to light touch, temperature, vibration  COORDINATION:  finger-nose-finger, fine finger movements normal  REFLEXES:  deep tendon reflexes 1+ and symmetric  GAIT/STATION:  narrow based gait    DIAGNOSTIC DATA (LABS, IMAGING, TESTING) - I reviewed patient records, labs, notes, testing and imaging myself where available.  Lab Results  Component Value Date   WBC 12.4 (H) 09/13/2024   HGB 13.6 09/13/2024   HCT 42.2 09/13/2024   MCV 87.4 09/13/2024   PLT 364 09/13/2024      Component Value Date/Time   NA 140 09/13/2024 1706   K 3.7 09/13/2024 1706   CL 101 09/13/2024 1706   CO2 28 09/13/2024 1706   GLUCOSE 86 09/13/2024 1706   BUN 6 09/13/2024 1706   CREATININE 0.72 09/13/2024 1706   CALCIUM 9.3 09/13/2024 1706   PROT 7.0 02/02/2022 1354   ALBUMIN 3.8 02/02/2022 1354   AST 19 02/02/2022 1354   ALT 19 02/02/2022 1354   ALKPHOS 66 02/02/2022 1354   BILITOT 0.2 (L) 02/02/2022 1354   GFRNONAA >60 09/13/2024 1706   No results found for: CHOL, HDL, LDLCALC, LDLDIRECT, TRIG, CHOLHDL No results found for: YHAJ8R Lab Results  Component Value Date   VITAMINB12 343 06/01/2024   No  results found for: TSH   09/13/24 CT head - normal   ASSESSMENT AND PLAN  48 y.o. year old female here with:  Dx:  1. Worsening headaches   2. Gait  difficulty   3. Dizziness   4. Migraine with aura and with status migrainosus, not intractable     PLAN:  WORSENING HEADACHE (since 09/11/24; could be related to prior migraine history vs other new secondary cause; history of lupus and Sjogren's dz on immunosuppression) - check MRI brain w/wo (rule out CNS autoimmune, inflamm, vascular causes) - continue nurtec 75mg  every other day - start topiramate 50mg  twice a day (drink plenty of water) - stop sumatriptan (not effective) - start rizatriptan 10mg  daily as needed - prednisone  taper pack (then continue 10mg  daily as previously)  Meds ordered this encounter  Medications   topiramate (TOPAMAX) 50 MG tablet    Sig: Take 1 tablet (50 mg total) by mouth 2 (two) times daily.    Dispense:  60 tablet    Refill:  12   rizatriptan (MAXALT-MLT) 10 MG disintegrating tablet    Sig: Take 1 tablet (10 mg total) by mouth as needed for migraine. May repeat in 2 hours if needed    Dispense:  9 tablet    Refill:  11   predniSONE  (DELTASONE ) 10 MG tablet    Sig: Take 60mg  on day 1. Reduce by 10mg  each subsequent day. (60, 50, 40, 30, 20, 10, stop)    Dispense:  21 tablet    Refill:  0   Orders Placed This Encounter  Procedures   MR BRAIN W WO CONTRAST   Return in about 3 months (around 12/23/2024) for MyChart visit (15 min).  I reviewed images, labs, notes, records myself. I summarized findings and reviewed with patient, for this high risk condition (severe worsening headaches, nausea) requiring high complexity decision making.    EDUARD FABIENE HANLON, MD 09/24/2024, 3:31 PM Certified in Neurology, Neurophysiology and Neuroimaging  [facility] [address] [place], [place] [address] [phone]     [1]  Allergies Allergen Reactions   Other Anaphylaxis   Cat Dander Itching

## 2024-09-24 NOTE — Patient Instructions (Addendum)
 WORSENING HEADACHE (since 09/11/24; could be related to prior migraine history vs other new secondary cause; history of lupus and Sjogren's dz on immunosuppression) - check MRI brain w/wo (rule out CNS autoimmune, inflamm, vascular causes) - continue nurtec 75mg  every other day - start topiramate 50mg  twice a day (drink plenty of water) - stop sumatriptan (not effective) - start rizatriptan 10mg  daily as needed - prednisone  taper pack (then continue 10mg  daily as previously)

## 2024-09-29 ENCOUNTER — Other Ambulatory Visit

## 2024-09-29 DIAGNOSIS — R269 Unspecified abnormalities of gait and mobility: Secondary | ICD-10-CM

## 2024-09-29 DIAGNOSIS — R42 Dizziness and giddiness: Secondary | ICD-10-CM | POA: Diagnosis not present

## 2024-09-29 DIAGNOSIS — R519 Headache, unspecified: Secondary | ICD-10-CM

## 2024-09-29 MED ORDER — GADOBENATE DIMEGLUMINE 529 MG/ML IV SOLN
19.0000 mL | Freq: Once | INTRAVENOUS | Status: AC | PRN
  Administered 2024-09-29: 09:00:00 19 mL via INTRAVENOUS

## 2024-10-02 ENCOUNTER — Ambulatory Visit: Payer: Self-pay | Admitting: Diagnostic Neuroimaging

## 2024-10-04 ENCOUNTER — Encounter: Payer: Self-pay | Admitting: Diagnostic Neuroimaging

## 2024-10-05 NOTE — Telephone Encounter (Signed)
 Patient calling to ask How do I treat my migraines if I have already taken the medication for 3 days in the week?  Because can only take 3 days in a week.

## 2024-10-05 NOTE — Telephone Encounter (Signed)
 Pt called  to follow up about Mychart message , Pt just wanted Md or Nurse to respond

## 2024-10-06 NOTE — Telephone Encounter (Signed)
 I called pt.  She continues with daily migraines.  Taking the rizatriptan  2 a day (3 days in a weeks time).  She says it helps some, but has dizziness, she does take excedrin migraine 2 tabs bid on days that she does not take the rizatriptan ,  prednisone  taper did not make a difference.  She is well hydrated.  Taking the topamax , nurtec.  She is going out of town Thursday am and trying to see if there is something else that may help.

## 2024-10-06 NOTE — Telephone Encounter (Signed)
 Pt  called requesting  a callback . Pt stated she has not heard anything back  and really need to speak to Nurse or MD

## 2024-10-20 ENCOUNTER — Ambulatory Visit: Admitting: Neurology

## 2024-10-22 ENCOUNTER — Other Ambulatory Visit (HOSPITAL_COMMUNITY): Payer: Self-pay

## 2024-10-23 ENCOUNTER — Encounter: Payer: Self-pay | Admitting: Hematology and Oncology

## 2024-10-23 ENCOUNTER — Other Ambulatory Visit (HOSPITAL_COMMUNITY): Payer: Self-pay

## 2024-10-23 MED ORDER — PRUCALOPRIDE SUCCINATE 2 MG PO TABS
1.0000 | ORAL_TABLET | Freq: Every day | ORAL | 3 refills | Status: DC
  Filled 2024-10-23: qty 30, 30d supply, fill #0

## 2024-10-29 ENCOUNTER — Encounter: Payer: Self-pay | Admitting: Hematology and Oncology

## 2024-11-11 ENCOUNTER — Other Ambulatory Visit: Payer: Self-pay

## 2024-11-11 MED ORDER — TOPIRAMATE 50 MG PO TABS: 50.0000 mg | ORAL_TABLET | Freq: Two times a day (BID) | ORAL | 0 refills | Status: AC

## 2024-11-11 MED ORDER — RIZATRIPTAN BENZOATE 10 MG PO TBDP: 10.0000 mg | ORAL_TABLET | ORAL | 11 refills | Status: AC | PRN

## 2024-11-30 ENCOUNTER — Other Ambulatory Visit: Payer: 59

## 2024-11-30 ENCOUNTER — Ambulatory Visit: Payer: 59 | Admitting: Oncology

## 2024-12-29 ENCOUNTER — Telehealth: Admitting: Diagnostic Neuroimaging
